# Patient Record
Sex: Female | Born: 1975 | Race: White | Hispanic: No | Marital: Married | Smoking: Former smoker
Health system: Southern US, Community
[De-identification: ages and names within clinical notes are randomized; demographics above are authoritative.]

## PROBLEM LIST (undated history)

## (undated) DIAGNOSIS — E878 Other disorders of electrolyte and fluid balance, not elsewhere classified: Secondary | ICD-10-CM

## (undated) DIAGNOSIS — F329 Major depressive disorder, single episode, unspecified: Secondary | ICD-10-CM

## (undated) DIAGNOSIS — I10 Essential (primary) hypertension: Secondary | ICD-10-CM

## (undated) DIAGNOSIS — K219 Gastro-esophageal reflux disease without esophagitis: Secondary | ICD-10-CM

## (undated) DIAGNOSIS — R519 Headache, unspecified: Secondary | ICD-10-CM

## (undated) DIAGNOSIS — C801 Malignant (primary) neoplasm, unspecified: Secondary | ICD-10-CM

## (undated) DIAGNOSIS — F419 Anxiety disorder, unspecified: Secondary | ICD-10-CM

## (undated) DIAGNOSIS — R609 Edema, unspecified: Secondary | ICD-10-CM

## (undated) DIAGNOSIS — N189 Chronic kidney disease, unspecified: Secondary | ICD-10-CM

## (undated) DIAGNOSIS — F32A Depression, unspecified: Secondary | ICD-10-CM

## (undated) HISTORY — DX: Anxiety disorder, unspecified: F41.9

## (undated) HISTORY — DX: Malignant (primary) neoplasm, unspecified: C80.1

## (undated) HISTORY — PX: CHOLECYSTECTOMY: SHX55

## (undated) HISTORY — DX: Depression, unspecified: F32.A

---
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
## 2006-02-18 ENCOUNTER — Observation Stay: Payer: Self-pay | Admitting: Obstetrics and Gynecology

## 2006-02-22 ENCOUNTER — Observation Stay: Payer: Self-pay | Admitting: Certified Nurse Midwife

## 2006-05-14 ENCOUNTER — Inpatient Hospital Stay: Payer: Self-pay | Admitting: Obstetrics and Gynecology

## 2007-04-19 ENCOUNTER — Ambulatory Visit: Payer: Self-pay | Admitting: Obstetrics and Gynecology

## 2008-09-11 ENCOUNTER — Ambulatory Visit: Payer: Self-pay | Admitting: Obstetrics and Gynecology

## 2008-09-14 ENCOUNTER — Ambulatory Visit: Payer: Self-pay | Admitting: Obstetrics and Gynecology

## 2010-09-02 ENCOUNTER — Ambulatory Visit: Payer: Self-pay | Admitting: Surgery

## 2010-09-05 ENCOUNTER — Ambulatory Visit: Payer: Self-pay | Admitting: Surgery

## 2010-09-08 LAB — PATHOLOGY REPORT

## 2011-11-11 ENCOUNTER — Emergency Department: Payer: Self-pay | Admitting: Emergency Medicine

## 2013-06-12 ENCOUNTER — Ambulatory Visit: Payer: Self-pay | Admitting: Obstetrics and Gynecology

## 2013-06-14 ENCOUNTER — Ambulatory Visit: Payer: Self-pay | Admitting: Obstetrics and Gynecology

## 2015-12-30 ENCOUNTER — Other Ambulatory Visit: Payer: Self-pay | Admitting: Obstetrics and Gynecology

## 2015-12-30 DIAGNOSIS — Z1231 Encounter for screening mammogram for malignant neoplasm of breast: Secondary | ICD-10-CM

## 2016-01-10 ENCOUNTER — Ambulatory Visit
Admission: RE | Admit: 2016-01-10 | Discharge: 2016-01-10 | Disposition: A | Discharge status: Home / Self Care | Payer: BLUE CROSS/BLUE SHIELD | Source: Ambulatory Visit | Attending: Obstetrics and Gynecology | Admitting: Obstetrics and Gynecology

## 2016-01-10 DIAGNOSIS — Z1231 Encounter for screening mammogram for malignant neoplasm of breast: Secondary | ICD-10-CM | POA: Diagnosis not present

## 2017-06-14 ENCOUNTER — Other Ambulatory Visit: Payer: Self-pay | Admitting: Obstetrics and Gynecology

## 2017-06-14 DIAGNOSIS — Z1231 Encounter for screening mammogram for malignant neoplasm of breast: Secondary | ICD-10-CM

## 2017-06-14 LAB — HM PAP SMEAR: HM Pap smear: NEGATIVE

## 2017-06-29 ENCOUNTER — Ambulatory Visit
Admission: RE | Admit: 2017-06-29 | Discharge: 2017-06-29 | Disposition: A | Discharge status: Home / Self Care | Payer: 59 | Source: Ambulatory Visit | Attending: Obstetrics and Gynecology | Admitting: Obstetrics and Gynecology

## 2017-06-29 DIAGNOSIS — Z1231 Encounter for screening mammogram for malignant neoplasm of breast: Secondary | ICD-10-CM | POA: Diagnosis present

## 2017-11-27 ENCOUNTER — Emergency Department
Admission: EM | Admit: 2017-11-27 | Discharge: 2017-11-27 | Disposition: A | Discharge status: Home / Self Care | Payer: 59 | Attending: Emergency Medicine | Admitting: Emergency Medicine

## 2017-11-27 ENCOUNTER — Other Ambulatory Visit: Payer: Self-pay

## 2017-11-27 ENCOUNTER — Emergency Department: Admit: 2017-11-27 | Discharge: 2017-11-27 | Discharge status: Absent without leave | Payer: Self-pay

## 2017-11-27 ENCOUNTER — Encounter: Payer: Self-pay | Admitting: *Deleted

## 2017-11-27 ENCOUNTER — Emergency Department: Payer: 59

## 2017-11-27 DIAGNOSIS — G43809 Other migraine, not intractable, without status migrainosus: Secondary | ICD-10-CM | POA: Diagnosis not present

## 2017-11-27 DIAGNOSIS — R110 Nausea: Secondary | ICD-10-CM | POA: Insufficient documentation

## 2017-11-27 DIAGNOSIS — G43109 Migraine with aura, not intractable, without status migrainosus: Secondary | ICD-10-CM

## 2017-11-27 DIAGNOSIS — R202 Paresthesia of skin: Secondary | ICD-10-CM | POA: Diagnosis present

## 2017-11-27 DIAGNOSIS — R200 Anesthesia of skin: Secondary | ICD-10-CM

## 2017-11-27 LAB — COMPREHENSIVE METABOLIC PANEL
ALT: 26 U/L (ref 14–54)
AST: 20 U/L (ref 15–41)
Albumin: 4.4 g/dL (ref 3.5–5.0)
Alkaline Phosphatase: 71 U/L (ref 38–126)
Anion gap: 11 (ref 5–15)
BUN: 22 mg/dL — ABNORMAL HIGH (ref 6–20)
CO2: 23 mmol/L (ref 22–32)
Calcium: 9.2 mg/dL (ref 8.9–10.3)
Chloride: 104 mmol/L (ref 101–111)
Creatinine, Ser: 0.94 mg/dL (ref 0.44–1.00)
GFR calc Af Amer: >60 mL/min (ref >60)
GFR calc non Af Amer: >60 mL/min (ref >60)
Glucose, Bld: 98 mg/dL (ref 65–99)
Potassium: 3.6 mmol/L (ref 3.5–5.1)
Sodium: 138 mmol/L (ref 135–145)
Total Bilirubin: 0.5 mg/dL (ref 0.3–1.2)
Total Protein: 7.2 g/dL (ref 6.5–8.1)

## 2017-11-27 LAB — DIFFERENTIAL
Basophils Absolute: 0.1 10*3/uL (ref 0–0.1)
Basophils Relative: 1 %
Eosinophils Absolute: 0.4 10*3/uL (ref 0–0.7)
Eosinophils Relative: 4 %
Lymphocytes Relative: 17 %
Lymphs Abs: 1.8 10*3/uL (ref 1.0–3.6)
Monocytes Absolute: 0.7 10*3/uL (ref 0.2–0.9)
Monocytes Relative: 6 %
Neutro Abs: 7.5 10*3/uL — ABNORMAL HIGH (ref 1.4–6.5)
Neutrophils Relative %: 72 %

## 2017-11-27 LAB — CBC
HCT: 41.8 % (ref 35.0–47.0)
Hemoglobin: 14.2 g/dL (ref 12.0–16.0)
MCH: 29.9 pg (ref 26.0–34.0)
MCHC: 33.9 g/dL (ref 32.0–36.0)
MCV: 88.2 fL (ref 80.0–100.0)
Platelets: 221 10*3/uL (ref 150–440)
RBC: 4.74 MIL/uL (ref 3.80–5.20)
RDW: 13.7 % (ref 11.5–14.5)
WBC: 10.5 10*3/uL (ref 3.6–11.0)

## 2017-11-27 LAB — PROTIME-INR
INR: 0.9
Prothrombin Time: 12.1 s (ref 11.4–15.2)

## 2017-11-27 LAB — TROPONIN I: Troponin I: <0.03 ng/mL (ref <0.03)

## 2017-11-27 LAB — APTT: aPTT: 30 seconds (ref 24–36)

## 2017-11-27 MED ORDER — DEXAMETHASONE SODIUM PHOSPHATE 10 MG/ML IJ SOLN
10.0000 mg | Freq: Once | INTRAMUSCULAR | Status: AC
  Administered 2017-11-27: 10 mg via INTRAVENOUS
  Filled 2017-11-27: qty 1

## 2017-11-27 MED ORDER — METOCLOPRAMIDE HCL 5 MG/ML IJ SOLN
10.0000 mg | Freq: Once | INTRAMUSCULAR | Status: AC
  Administered 2017-11-27: 10 mg via INTRAVENOUS
  Filled 2017-11-27: qty 2

## 2017-11-27 MED ORDER — SUMATRIPTAN SUCCINATE 50 MG PO TABS: 50.0000 mg | ORAL_TABLET | Freq: Once | ORAL | 2 refills | Status: DC | PRN

## 2017-11-27 MED ORDER — KETOROLAC TROMETHAMINE 30 MG/ML IJ SOLN
30.0000 mg | Freq: Once | INTRAMUSCULAR | Status: AC
  Administered 2017-11-27: 30 mg via INTRAVENOUS
  Filled 2017-11-27: qty 1

## 2017-11-27 MED ORDER — SODIUM CHLORIDE 0.9 % IV BOLUS (SEPSIS)
1000.0000 mL | Freq: Once | INTRAVENOUS | Status: AC
  Administered 2017-11-27: 1000 mL via INTRAVENOUS

## 2017-11-27 NOTE — ED Provider Notes (Signed)
Patient reports her symptoms are improving and numbness has resolved.  This is likely a migraine variant.  She is stable for outpatient follow-up.   Earleen Newport, MD 11/27/17 2212

## 2017-11-27 NOTE — ED Notes (Signed)
Pt stated that Wednesday she had the worst headache of her life on the left side of her head then Thursday she had episodes of nausea off and on all day Friday she had a dull aching headache on left side of head and face Today pt reports that approx 2p-3p she started having facial numbness with drooling - she reports pressure against her bottom lip but no true sensation - at 6pm she reports that the left side of her face went numb - pt is able to talk in complete sentences that are understandable with no speech deficit noted

## 2017-11-27 NOTE — ED Provider Notes (Signed)
[facility] Emergency Department Provider Note ____________________________________________   First MD Initiated Contact with Patient 11/27/17 [phone]     (approximate)  I have reviewed the triage vital signs and the nursing notes.   HISTORY  Chief Complaint Numbness    HPI Margaret Waller is a 42 y.o. female with no significant past medical history who presents with left facial numbness, acute onset approximately 2 hours ago and involving the entire left side of the face and particularly around the lower lip.  Patient states that for the last 3 days she developed relatively severe headache on the left side of her head and face, associated with nausea, with no vomiting or photophobia.  No prior history of a headache like this.  She then developed the numbness today, which caused her to drool because she could not feel liquid against her bottom lip.  No prior history of the numbness.  Patient denies any recent illness, fevers, chest pain, difficulty breathing, vision changes, or any numbness or weakness throughout the rest of her body.  History reviewed. No pertinent past medical history.  There are no active problems to display for this patient.   History reviewed. No pertinent surgical history.  Prior to Admission medications   Not on File    Allergies Patient has no known allergies.  Family History  Problem Relation Age of Onset  . Breast cancer Neg Hx     Social History Social History   Tobacco Use  . Smoking status: Never Smoker  . Smokeless tobacco: Never Used  Substance Use Topics  . Alcohol use: Yes    Frequency: Never    Comment: rarely  . Drug use: No    Review of Systems  Constitutional: No fever/chills. Eyes: No visual changes. ENT: No neck pain. Cardiovascular: Denies chest pain. Respiratory: Denies shortness of breath. Gastrointestinal: Positive for nausea, no vomiting. Genitourinary: Negative for dysuria.    Musculoskeletal: Negative for back pain. Skin: Negative for rash. Neurological: Positive for headache.   ____________________________________________   PHYSICAL EXAM:  VITAL SIGNS: ED Triage Vitals  Enc Vitals Group     BP 11/27/17 1858 132/78     Pulse Rate 11/27/17 1858 91     Resp 11/27/17 1858 17     Temp 11/27/17 1858 98.9 F (37.2 C)     Temp Source 11/27/17 1858 Oral     SpO2 11/27/17 1858 99 %     Weight 11/27/17 1901 114 lb (51.7 kg)     Height 11/27/17 1901 5\' 1"  (1.549 m)     Head Circumference --      Peak Flow --      Pain Score --      Pain Loc --      Pain Edu? --      Excl. in [place]? --     Constitutional: Alert and oriented.  Relatively comfortable appearing and in no acute distress. Eyes: Conjunctivae are normal.  EOMI.  PERRLA.  No photophobia. Head: Atraumatic. Nose: No congestion/rhinnorhea. Mouth/Throat: Mucous membranes are moist.   Neck: Normal range of motion.  No meningeal signs. Cardiovascular:   Good peripheral circulation. Respiratory: Normal respiratory effort.  No retractions.  Gastrointestinal: No distention.  Musculoskeletal: No lower extremity edema.  Extremities warm and well perfused.  Neurologic:  Normal speech and language. No gross focal neurologic deficits are appreciated.  Motor and sensory intact in all extremities.  Decreased sensation throughout the left face, but cranial nerves III through XII otherwise intact.  Normal coordination with no ataxia. Skin:  Skin is warm and dry. No rash noted. Psychiatric: Mood and affect are normal. Speech and behavior are normal.  ____________________________________________   LABS (all labs ordered are listed, but only abnormal results are displayed)  Labs Reviewed  DIFFERENTIAL - Abnormal; Notable for the following components:      Result Value   Neutro Abs 7.5 (*)    All other components within normal limits  COMPREHENSIVE METABOLIC PANEL - Abnormal; Notable for the following  components:   BUN 22 (*)    All other components within normal limits  PROTIME-INR  APTT  CBC  TROPONIN I  CBG MONITORING, ED  POC URINE PREG, ED   ____________________________________________  EKG  ED ECG REPORT I, Arta Silence, the attending physician, personally viewed and interpreted this ECG.  Date: 11/27/2017 EKG Time: 1904 Rate: 94 Rhythm: normal sinus rhythm QRS Axis: normal Intervals: normal ST/T Wave abnormalities: normal Narrative Interpretation: no evidence of acute ischemia  ____________________________________________  RADIOLOGY  CT head: No ICH or other acute findings.  ____________________________________________   PROCEDURES  Procedure(s) performed: No    Critical Care performed: No ____________________________________________   INITIAL IMPRESSION / ASSESSMENT AND PLAN / ED COURSE  Pertinent labs & imaging results that were available during my care of the patient were reviewed by me and considered in my medical decision making (see chart for details).  42 year old female with no significant past medical history presents with left facial numbness over the last 2 hours, but proceeded with left-sided headache over the last 2 days.  No prior history of the symptoms.  No other recent illness.  Past medical records reviewed in Epic and are noncontributory.  On exam, the patient is relatively well-appearing, the vital signs are normal, and there is decreased sensation to the entire left side of the face but otherwise no neurologic deficits on the road neuro exam.  Given that patient presented within 2-3 hours of symptom onset, we obtained CT head to rule out ICH or findings related to CVA.  However overall the presentation is not consistent with CVA/TIA given the presence of headache, the narrow extent of the symptoms, and the patient's overall lack of stroke risk factors.  Presentation more consistent with complicated migraine, versus less likely  trigeminal neuralgia or other peripheral etiology.  Plan for lab workup, neurology consult and reassess.  ----------------------------------------- 8:33 PM on 11/27/2017 -----------------------------------------  CT is negative.  I consulted Dr. Irish Elders from neurology, who agrees that the presentation is most consistent with a comp gated migraine.  He recommends symptomatic migraine treatment including Decadron, and then reassess.  If symptoms resolve in the ED after several hours, then he recommends likely discharge home.  If symptoms persist then the patient should be admitted for MRI.  I am signing patient out to the oncoming physician Dr. Jimmye Norman.   ____________________________________________   FINAL CLINICAL IMPRESSION(S) / ED DIAGNOSES  Final diagnoses:  Facial numbness      NEW MEDICATIONS STARTED DURING THIS VISIT:  New Prescriptions   No medications on file     Note:  This document was prepared using Dragon voice recognition software and may include unintentional dictation errors.    Arta Silence, MD 11/27/17 2034

## 2017-11-27 NOTE — ED Triage Notes (Signed)
Pt c/o L facial numbness x 2-3 hrs ago ( 1430-1500) today. Pt c/ L facial pain starting x 3 days ago, constant, aching. Pt denies associated sxs, excepting nausea that started Thursday morning at 1100, intermittently occurring Thursday and FRiday. Pt in ambulatory and in no acute distress at this time, no weakness or dizziness at this time. CSS negative, speech clear and pt is oriented x 4.

## 2017-11-27 NOTE — ED Notes (Addendum)
Patient transported to CT - per Dr Cherylann Banas stated that pt is not being called a code stroke

## 2017-12-13 ENCOUNTER — Other Ambulatory Visit: Payer: Self-pay | Admitting: Neurology

## 2017-12-13 DIAGNOSIS — R200 Anesthesia of skin: Secondary | ICD-10-CM

## 2017-12-13 DIAGNOSIS — G43109 Migraine with aura, not intractable, without status migrainosus: Secondary | ICD-10-CM

## 2017-12-13 DIAGNOSIS — R202 Paresthesia of skin: Secondary | ICD-10-CM

## 2017-12-21 ENCOUNTER — Ambulatory Visit
Admission: RE | Admit: 2017-12-21 | Discharge: 2017-12-21 | Disposition: A | Discharge status: Home / Self Care | Payer: 59 | Source: Ambulatory Visit | Attending: Neurology | Admitting: Neurology

## 2017-12-21 DIAGNOSIS — J349 Unspecified disorder of nose and nasal sinuses: Secondary | ICD-10-CM | POA: Diagnosis not present

## 2017-12-21 DIAGNOSIS — R200 Anesthesia of skin: Secondary | ICD-10-CM | POA: Insufficient documentation

## 2017-12-21 DIAGNOSIS — G43109 Migraine with aura, not intractable, without status migrainosus: Secondary | ICD-10-CM | POA: Diagnosis present

## 2017-12-21 DIAGNOSIS — R202 Paresthesia of skin: Secondary | ICD-10-CM | POA: Diagnosis present

## 2017-12-21 MED ORDER — GADOBENATE DIMEGLUMINE 529 MG/ML IV SOLN
10.0000 mL | Freq: Once | INTRAVENOUS | Status: AC | PRN
  Administered 2017-12-21: 10 mL via INTRAVENOUS

## 2018-06-20 ENCOUNTER — Other Ambulatory Visit: Payer: Self-pay | Admitting: Obstetrics and Gynecology

## 2018-06-20 DIAGNOSIS — Z1231 Encounter for screening mammogram for malignant neoplasm of breast: Secondary | ICD-10-CM

## 2018-07-05 ENCOUNTER — Ambulatory Visit
Admission: RE | Admit: 2018-07-05 | Discharge: 2018-07-05 | Disposition: A | Discharge status: Home / Self Care | Payer: 59 | Source: Ambulatory Visit | Attending: Obstetrics and Gynecology | Admitting: Obstetrics and Gynecology

## 2018-07-05 DIAGNOSIS — Z1231 Encounter for screening mammogram for malignant neoplasm of breast: Secondary | ICD-10-CM | POA: Insufficient documentation

## 2018-07-12 ENCOUNTER — Other Ambulatory Visit: Payer: Self-pay | Admitting: Obstetrics and Gynecology

## 2018-07-12 DIAGNOSIS — N6489 Other specified disorders of breast: Secondary | ICD-10-CM

## 2018-07-12 DIAGNOSIS — R921 Mammographic calcification found on diagnostic imaging of breast: Secondary | ICD-10-CM

## 2018-07-12 DIAGNOSIS — R928 Other abnormal and inconclusive findings on diagnostic imaging of breast: Secondary | ICD-10-CM

## 2018-07-18 ENCOUNTER — Ambulatory Visit
Admission: RE | Admit: 2018-07-18 | Discharge: 2018-07-18 | Disposition: A | Discharge status: Home / Self Care | Payer: 59 | Source: Ambulatory Visit | Attending: Obstetrics and Gynecology | Admitting: Obstetrics and Gynecology

## 2018-07-18 DIAGNOSIS — N6489 Other specified disorders of breast: Secondary | ICD-10-CM

## 2018-07-18 DIAGNOSIS — R921 Mammographic calcification found on diagnostic imaging of breast: Secondary | ICD-10-CM | POA: Diagnosis present

## 2018-07-18 DIAGNOSIS — R928 Other abnormal and inconclusive findings on diagnostic imaging of breast: Secondary | ICD-10-CM | POA: Diagnosis present

## 2019-01-23 ENCOUNTER — Other Ambulatory Visit (HOSPITAL_COMMUNITY): Payer: Self-pay | Admitting: Neurology

## 2019-01-23 ENCOUNTER — Other Ambulatory Visit: Payer: Self-pay | Admitting: Neurology

## 2019-01-23 DIAGNOSIS — G4459 Other complicated headache syndrome: Secondary | ICD-10-CM

## 2019-01-28 ENCOUNTER — Other Ambulatory Visit: Payer: Self-pay

## 2019-01-28 ENCOUNTER — Ambulatory Visit
Admission: RE | Admit: 2019-01-28 | Discharge: 2019-01-28 | Disposition: A | Discharge status: Home / Self Care | Payer: 59 | Source: Ambulatory Visit | Attending: Neurology | Admitting: Neurology

## 2019-01-28 DIAGNOSIS — G4459 Other complicated headache syndrome: Secondary | ICD-10-CM | POA: Diagnosis present

## 2019-01-28 MED ORDER — GADOBUTROL 1 MMOL/ML IV SOLN
5.0000 mL | Freq: Once | INTRAVENOUS | Status: AC | PRN
  Administered 2019-01-28: 5 mL via INTRAVENOUS

## 2019-05-16 ENCOUNTER — Ambulatory Visit (INDEPENDENT_AMBULATORY_CARE_PROVIDER_SITE_OTHER): Payer: 59 | Admitting: Physician Assistant

## 2019-05-16 ENCOUNTER — Encounter: Payer: Self-pay | Admitting: Physician Assistant

## 2019-05-16 ENCOUNTER — Other Ambulatory Visit: Payer: Self-pay

## 2019-05-16 VITALS — BP 122/87 | HR 93 | Temp 98.7°F | Resp 16 | Ht 60.5 in | Wt 135.0 lb

## 2019-05-16 DIAGNOSIS — Z131 Encounter for screening for diabetes mellitus: Secondary | ICD-10-CM

## 2019-05-16 DIAGNOSIS — F4321 Adjustment disorder with depressed mood: Secondary | ICD-10-CM

## 2019-05-16 DIAGNOSIS — F4323 Adjustment disorder with mixed anxiety and depressed mood: Secondary | ICD-10-CM | POA: Diagnosis not present

## 2019-05-16 DIAGNOSIS — Z114 Encounter for screening for human immunodeficiency virus [HIV]: Secondary | ICD-10-CM

## 2019-05-16 DIAGNOSIS — G43809 Other migraine, not intractable, without status migrainosus: Secondary | ICD-10-CM | POA: Diagnosis not present

## 2019-05-16 DIAGNOSIS — Z13220 Encounter for screening for lipoid disorders: Secondary | ICD-10-CM

## 2019-05-16 DIAGNOSIS — Z1329 Encounter for screening for other suspected endocrine disorder: Secondary | ICD-10-CM

## 2019-05-16 DIAGNOSIS — Z130 Encounter for screening for diseases of the blood and blood-forming organs and certain disorders involving the immune mechanism: Secondary | ICD-10-CM

## 2019-05-16 MED ORDER — ESCITALOPRAM OXALATE 10 MG PO TABS: 10.0000 mg | ORAL_TABLET | Freq: Every day | ORAL | 0 refills | Status: DC

## 2019-05-16 NOTE — Patient Instructions (Signed)
Serotonin Syndrome  Serotonin is a chemical in your body (neurotransmitter) that helps to control several functions, such as:  · Brain and nerve cell function.  · Mood and emotions.  · Memory.  · Eating.  · Sleeping.  · Sexual activity.  · Stress response.  Having too much serotonin in your body can cause serotonin syndrome. This condition can be harmful to your brain and nerve cells. This can be a life-threatening condition.  What are the causes?  This condition may be caused by taking medicines or drugs that increase the level of serotonin in your body, such as:  · Antidepressant medicines.  · Migraine medicines.  · Certain pain medicines.  · Certain drugs, including ecstasy, LSD, cocaine, and amphetamines.  · Over-the-counter cough or cold medicines that contain dextromethorphan.  · Certain herbal supplements, including St. John's wort, ginseng, and nutmeg.  This condition usually occurs when you take these medicines or drugs in combination, but it can also happen with a high dose of a single medicine or drug.  What increases the risk?  You are more likely to develop this condition if:  · You just started taking a medicine or drug that increases the level of serotonin in the body.  · You recently increased the dose of a medicine or drug that increases the level of serotonin in the body.  · You take more than one medicine or drug that increases the level of serotonin in the body.  What are the signs or symptoms?  Symptoms of this condition usually start within several hours of taking a medicine or drug. Symptoms may be mild or severe. Mild symptoms include:  · Sweating.  · Restlessness or agitation.  · Muscle twitching or stiffness.  · Rapid heart rate.  · Nausea and vomiting.  · Diarrhea.  · Headache.  · Shivering or goose bumps.  · Confusion.  Severe symptoms include:  · Irregular heartbeat.  · Seizures.  · Loss of consciousness.  · High fever.  How is this diagnosed?  This condition may be diagnosed based  on:  · Your medical history.   · A physical exam.  · Your prior use of drugs and medicines.  · Blood or urine tests. These may be used to rule out other causes of your symptoms.  How is this treated?  The treatment for this condition depends on the severity of your symptoms.  · For mild cases, stopping the medicine or drug that caused your condition is usually all that is needed.  · For moderate to severe cases, treatment in a hospital may be needed to prevent or manage life-threatening symptoms. This may include medicines to control your symptoms, IV fluids, interventions to support your breathing, and treatments to control your body temperature.  Follow these instructions at home:  Medicines    · Take over-the-counter and prescription medicines only as told by your health care provider. This is important.  · Check with your health care provider before you start taking any new prescriptions, over-the-counter medicines, herbs, or supplements.  · Avoid combining any medicines that can cause this condition to occur.  Lifestyle    · Maintain a healthy lifestyle.  ? Eat a healthy diet that includes plenty of vegetables, fruits, whole grains, low-fat dairy products, and lean protein. Do not eat a lot of foods that are high in fat, added sugars, or salt.  ? Get the right amount and quality of sleep. Most adults need 7-9 hours of sleep each night.  ?   or tobacco, such as cigarettes and e-cigarettes. If you need help quitting, ask your health care provider.  Keep all follow-up visits as told by your health care provider. This is important. Contact a health care provider if:  Your symptoms do not  improve or they get worse. Get help right away if you:  Have worsening confusion, severe headache, chest pain, high fever, seizures, or loss of consciousness.  Experience serious side effects of medicine, such as swelling of your face, lips, tongue, or throat.  Have serious thoughts about hurting yourself or others. These symptoms may represent a serious problem that is an emergency. Do not wait to see if the symptoms will go away. Get medical help right away. Call your local emergency services (911 in the [place] Do not drive yourself to the hospital. If you ever feel like you may hurt yourself or others, or have thoughts about taking your own life, get help right away. You can go to your nearest emergency department or call:  Your local emergency services (911 in the [place]  A suicide crisis helpline, such as the [facility] at 512-657-4974. This is open 24 hours a day. Summary  Serotonin is a brain chemical that helps to regulate the nervous system. High levels of serotonin in the body can cause serotonin syndrome, which is a very dangerous condition.  This condition may be caused by taking medicines or drugs that increase the level of serotonin in your body.  Treatment depends on the severity of your symptoms. For mild cases, stopping the medicine or drug that caused your condition is usually all that is needed.  Check with your health care provider before you start taking any new prescriptions, over-the-counter medicines, herbs, or supplements. This information is not intended to replace advice given to you by your health care provider. Make sure you discuss any questions you have with your health care provider. Document Released: 12/03/2004 Document Revised: 12/03/2017 Document Reviewed: 12/03/2017 Elsevier Patient Education  2020 [employer].

## 2019-05-16 NOTE — Progress Notes (Signed)
Patient: Margaret Waller, Female    DOB: 10-27-1976, 43 y.o.   MRN: [id number] Visit Date: 05/16/2019  Today's Provider: Trinna Post, PA-C   Chief Complaint  Patient presents with  . New Patient (Initial Visit)  . Insomnia   Subjective:     Annual physical exam Margaret Waller is a 43 y.o. female who presents today to establish care. Patient reports that she is having insomnia, patient reports she can fall sleep but wakes up at 3 am. Patient reports that she takes 6 mg of melatonin. Patient reports that she is depressed, her father passed away two months ago and her son is in the [employer] in [place].   Previously saw Dr. Bernita Buffy for primary care but has not seen him in many years.   Living in [place] with husband and youngest daughter. Two oldest sons are grown and out of the house. 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  Sees [facility] and had PAP in 2017/02/06.   Acid Reflux: Father died of esophageal cancer two months ago. Sister with stomach ulcers. Currently taking omeprazole 20 mg daily. Takes medication every morning on an empty stomach 30 min prior to meal. Reports that by night time when she lays down heart burn worse. Does drink one cup of coffee. Doesn't eat spicy food. She does have migraines and is on prophylactic topamax as well as pamelor. She previously got SPG blocks but has been unable to do so due to [place]. She reports headaches have worsened since then she will take [facility] powder daily, sometimes multiple times a day.   Migraine: She is followed by Dr. Manuella Ghazi at [facility] and takes topamax 200 mg daily, pamelor 50 mg daily and received SPG blocks previously.  She has an upcoming sleep study scheduled 05/29/2019.   Depression and Anxiety: has a history of the same and was treated in the past with Lexapro which she reports worked well.  Wt Readings from Last 3 Encounters:  05/16/19 135 lb (61.2 kg)   11/27/17 114 lb (51.7 kg)    -----------------------------------------------------------------   Review of Systems  Constitutional: Positive for fatigue.  HENT: Negative.   Eyes: Negative.   Respiratory: Negative.   Cardiovascular: Negative.   Gastrointestinal: Negative.   Endocrine: Negative.   Genitourinary: Negative.   Musculoskeletal: Negative.   Skin: Negative.   Allergic/Immunologic: Negative.   Neurological: Positive for headaches.  Hematological: Negative.   Psychiatric/Behavioral: Positive for sleep disturbance. The patient is nervous/anxious.     Social History      She  reports that she has never smoked. She has never used smokeless tobacco. She reports previous alcohol use. She reports that she does not use drugs.       Social History   Socioeconomic History  . Marital status: Married    Spouse name: Not on file  . Number of children: Not on file  . Years of education: Not on file  . Highest education level: Not on file  Occupational History  . Not on file  Social Needs  . Financial resource strain: Not on file  . Food insecurity    Worry: Not on file    Inability: Not on file  . Transportation needs    Medical: Not on file    Non-medical: Not on file  Tobacco Use  . Smoking status: Never Smoker  . Smokeless tobacco: Never Used  Substance and Sexual Activity  .  Alcohol use: Not Currently    Frequency: Never    Comment: rarely  . Drug use: No  . Sexual activity: Never    Birth control/protection: Surgical  Lifestyle  . Physical activity    Days per week: Not on file    Minutes per session: Not on file  . Stress: Not on file  Relationships  . Social Herbalist on phone: Not on file    Gets together: Not on file    Attends religious service: Not on file    Active member of club or organization: Not on file    Attends meetings of clubs or organizations: Not on file    Relationship status: Not on file  Other Topics Concern  . Not  on file  Social History Narrative  . Not on file    Past Medical History:  Diagnosis Date  . Anxiety   . Cancer ([place])   . Depression      There are no active problems to display for this patient.   No past surgical history on file.  Family History        Family Status  Relation Name Status  . Ethlyn Daniels  (Not Specified)  . Ethlyn Daniels  (Not Specified)  . Mother  Alive  . Father  Deceased  . Sister  Alive  . Daughter  Alive  . Son  Alive  . Son  Alive        Her family history includes Breast cancer in her paternal aunt and paternal aunt; Cancer in her father; Healthy in her daughter, son, and son; Hypertension in her mother; Lupus in her sister; Multiple sclerosis in her sister; Skin cancer in her mother.      No Known Allergies   Current Outpatient Medications:  .  Cholecalciferol (VITAMIN D-1000 MAX ST) 25 MCG (1000 UT) tablet, Take by mouth., Disp: , Rfl:  .  loratadine (CLARITIN) 10 MG tablet, Take 10 mg by mouth daily., Disp: , Rfl:  .  nortriptyline (PAMELOR) 50 MG capsule, Take by mouth., Disp: , Rfl:  .  omeprazole (PRILOSEC) 20 MG capsule, Take 20 mg by mouth daily., Disp: , Rfl:  .  Topiramate ER (TROKENDI XR) 200 MG CP24, Take by mouth., Disp: , Rfl:    Patient Care Team: Paulene Floor as PCP - General (Physician Assistant)    Objective:    Vitals: BP 122/87 (BP Location: Left Arm, Patient Position: Sitting, Cuff Size: Normal)   Pulse 93   Temp 98.7 F (37.1 C) (Oral)   Resp 16   Ht 5' 0.5" (1.537 m)   Wt 135 lb (61.2 kg)   SpO2 98%   BMI 25.93 kg/m    Vitals:   05/16/19 1112  BP: 122/87  Pulse: 93  Resp: 16  Temp: 98.7 F (37.1 C)  TempSrc: Oral  SpO2: 98%  Weight: 135 lb (61.2 kg)  Height: 5' 0.5" (1.537 m)     Physical Exam Constitutional:      Appearance: Normal appearance.  Cardiovascular:     Rate and Rhythm: Normal rate and regular rhythm.     Heart sounds: Normal heart sounds.  Pulmonary:     Effort: Pulmonary  effort is normal.     Breath sounds: Normal breath sounds.  Skin:    General: Skin is warm and dry.  Neurological:     Mental Status: She is alert and oriented to person, place, and time. Mental status is at  baseline.  Psychiatric:        Mood and Affect: Mood normal.        Behavior: Behavior normal.      Depression Screen PHQ 2/9 Scores 05/16/2019  PHQ - 2 Score 3  PHQ- 9 Score 16       Assessment & Plan:     Routine Health Maintenance and Physical Exam  Exercise Activities and Dietary recommendations Goals   None      There is no immunization history on file for this patient.  Health Maintenance  Topic Date Due  . HIV Screening  04/08/1990  . TETANUS/TDAP  04/08/1994  . PAP SMEAR-Modifier  04/08/1996  . INFLUENZA VACCINE  06/10/2019     Discussed health benefits of physical activity, and encouraged her to engage in regular exercise appropriate for her age and condition.    1. Other migraine without status migrainosus, not intractable  [facility]. She uses excessive amounts of BC powder and I suspect this is contributing to her GI issues. Recommended she contact neurology to schedule procedure so she will not have to use BC powder, counseled on risks associated with this.  2. Adjustment reaction with anxiety and depression  - escitalopram (LEXAPRO) 10 MG tablet; Take 1 tablet (10 mg total) by mouth daily.  Dispense: 90 tablet; Refill: 0  3. Diabetes mellitus screening  - Comprehensive Metabolic Panel (CMET)  4. Thyroid disorder screening  - TSH  5. Screening cholesterol level  - Lipid Profile  6. Encounter for screening for HIV  - HIV antibody (with reflex)  7. Screening for deficiency anemia  - CBC with Differential  8. Grief  - Ambulatory referral to Chronic Care Management Services  The entirety of the information documented in the History of Present Illness, Review of Systems and Physical Exam were personally obtained  by me. Portions of this information were initially documented by Lynford Humphrey, CMA and reviewed by me for thoroughness and accuracy.   F/u 6 weeks for anxiety and depression --------------------------------------------------------------------    Trinna Post, PA-C  [place]

## 2019-05-17 ENCOUNTER — Ambulatory Visit: Payer: Self-pay

## 2019-05-17 LAB — COMPREHENSIVE METABOLIC PANEL
ALT: 17 IU/L (ref 0–32)
AST: 12 IU/L (ref 0–40)
Albumin/Globulin Ratio: 2 (ref 1.2–2.2)
Albumin: 4.6 g/dL (ref 3.8–4.8)
Alkaline Phosphatase: 76 IU/L (ref 39–117)
BUN/Creatinine Ratio: 9 (ref 9–23)
BUN: 9 mg/dL (ref 6–24)
Bilirubin Total: 0.3 mg/dL (ref 0.0–1.2)
CO2: 19 mmol/L — ABNORMAL LOW (ref 20–29)
Calcium: 9.3 mg/dL (ref 8.7–10.2)
Chloride: 112 mmol/L — ABNORMAL HIGH (ref 96–106)
Creatinine, Ser: 1.02 mg/dL — ABNORMAL HIGH (ref 0.57–1.00)
GFR calc Af Amer: 77 mL/min/{1.73_m2} (ref >59)
GFR calc non Af Amer: 67 mL/min/{1.73_m2} (ref >59)
Globulin, Total: 2.3 g/dL (ref 1.5–4.5)
Glucose: 83 mg/dL (ref 65–99)
Potassium: 4 mmol/L (ref 3.5–5.2)
Sodium: 144 mmol/L (ref 134–144)
Total Protein: 6.9 g/dL (ref 6.0–8.5)

## 2019-05-17 LAB — LIPID PANEL
Chol/HDL Ratio: 2.9 ratio (ref 0.0–4.4)
Cholesterol, Total: 167 mg/dL (ref 100–199)
HDL: 58 mg/dL (ref >39)
LDL Calculated: 95 mg/dL (ref 0–99)
Triglycerides: 68 mg/dL (ref 0–149)
VLDL Cholesterol Cal: 14 mg/dL (ref 5–40)

## 2019-05-17 LAB — CBC WITH DIFFERENTIAL/PLATELET
Basophils Absolute: 0.1 10*3/uL (ref 0.0–0.2)
Basos: 1 %
EOS (ABSOLUTE): 0.3 10*3/uL (ref 0.0–0.4)
Eos: 4 %
Hematocrit: 41.6 % (ref 34.0–46.6)
Hemoglobin: 14 g/dL (ref 11.1–15.9)
Immature Grans (Abs): 0 10*3/uL (ref 0.0–0.1)
Immature Granulocytes: 0 %
Lymphocytes Absolute: 2.2 10*3/uL (ref 0.7–3.1)
Lymphs: 34 %
MCH: 28.3 pg (ref 26.6–33.0)
MCHC: 33.7 g/dL (ref 31.5–35.7)
MCV: 84 fL (ref 79–97)
Monocytes Absolute: 0.5 10*3/uL (ref 0.1–0.9)
Monocytes: 7 %
Neutrophils Absolute: 3.5 10*3/uL (ref 1.4–7.0)
Neutrophils: 54 %
Platelets: 270 10*3/uL (ref 150–450)
RBC: 4.95 x10E6/uL (ref 3.77–5.28)
RDW: 13.3 % (ref 11.7–15.4)
WBC: 6.5 10*3/uL (ref 3.4–10.8)

## 2019-05-17 LAB — HIV ANTIBODY (ROUTINE TESTING W REFLEX): HIV Screen 4th Generation wRfx: NONREACTIVE

## 2019-05-17 LAB — TSH: TSH: 0.793 u[IU]/mL (ref 0.450–4.500)

## 2019-05-17 NOTE — Chronic Care Management (AMB) (Signed)
    Care Management   Unsuccessful Call Note 05/17/2019 Name: FAYRENE TOWNER MRN: [id number] DOB: [birth year]  Lashaunda Schild is a 43 year old female who sees Carles Collet, [place] for primary care. Ms. Terrilee Croak asked the CCM team to consult the patient for care management, specifically social work secondary to her need for grief counseling.    Was unable to reach patient via telephone today for introduction to CCM Services. I have left HIPAA compliant voicemail asking patient to return my call. (unsuccessful outreach #1).   Plan: Will follow-up within 7 business days via telephone.     Capri Raben E. Rollene Rotunda, RN, BSN Nurse Care Coordinator [facility] Care Management [phone]

## 2019-05-26 ENCOUNTER — Ambulatory Visit: Payer: Self-pay | Admitting: *Deleted

## 2019-05-26 NOTE — Chronic Care Management (AMB) (Signed)
    Care Management   Unsuccessful Call Note 05/26/2019 Name: Margaret Waller MRN: [id number] DOB: 01/06/1976  Patient  is a 43 year old female who sees Carles Collet, [place] for primary care. Carles Collet, PA-C asked the CCM team to consult the patient for grief counseling.       This social worker was unable to reach patient via telephone today for consent for CCM services. I have left HIPAA compliant voicemail asking patient to return my call. (unsuccessful outreach #2).   Plan: Will follow-up within 7 business days via telephone.      Elliot Gurney, [place] Worker  [place] [facility] Care Management [phone]

## 2019-06-05 ENCOUNTER — Ambulatory Visit: Payer: Self-pay | Admitting: *Deleted

## 2019-06-05 NOTE — Chronic Care Management (AMB) (Signed)
    Care Management   Unsuccessful Call Note 06/05/2019 Name: Margaret Waller MRN: [id number] DOB: [birth year]  Patient is a 43 year old female who sees Carles Collet, [place] for primary care. Carles Collet, PA-C asked the CCM team to consult the patient for counseling and mental health resources.     This social worker was unable to reach patient via telephone today to obtain consent for CM services. I have left HIPAA compliant voicemail asking patient to return my call. (unsuccessful outreach #3).   Plan: Three attempts made to reach patient to consent for services, which have been unsuccessful. This Education officer, museum will cease any further attempts to reach out to patient, however will be happy to engage patient upon her return call.    Elliot Gurney, LCSW Clinical Social Worker  [place] [facility] Care Management [phone]     Signature

## 2019-06-14 ENCOUNTER — Encounter: Payer: Self-pay | Admitting: Physician Assistant

## 2019-06-14 DIAGNOSIS — F4323 Adjustment disorder with mixed anxiety and depressed mood: Secondary | ICD-10-CM

## 2019-06-14 MED ORDER — BUSPIRONE HCL 7.5 MG PO TABS: 7.5000 mg | ORAL_TABLET | Freq: Two times a day (BID) | ORAL | 0 refills | Status: DC

## 2019-06-14 NOTE — Telephone Encounter (Signed)
Please schedule patient for 6 week follow up either telephone or video for anxiety.

## 2019-06-22 ENCOUNTER — Other Ambulatory Visit: Payer: Self-pay | Admitting: Certified Nurse Midwife

## 2019-06-22 DIAGNOSIS — Z1231 Encounter for screening mammogram for malignant neoplasm of breast: Secondary | ICD-10-CM

## 2019-06-28 ENCOUNTER — Encounter: Payer: Self-pay | Admitting: Physician Assistant

## 2019-07-06 ENCOUNTER — Encounter: Payer: Self-pay | Admitting: Physician Assistant

## 2019-07-06 NOTE — Telephone Encounter (Signed)
LMOVM for pt to cb to schedule ov or virtual appt

## 2019-07-07 ENCOUNTER — Telehealth (INDEPENDENT_AMBULATORY_CARE_PROVIDER_SITE_OTHER): Payer: 59 | Admitting: Physician Assistant

## 2019-07-07 ENCOUNTER — Encounter: Payer: Self-pay | Admitting: Physician Assistant

## 2019-07-07 DIAGNOSIS — E878 Other disorders of electrolyte and fluid balance, not elsewhere classified: Secondary | ICD-10-CM | POA: Diagnosis not present

## 2019-07-07 DIAGNOSIS — F32A Depression, unspecified: Secondary | ICD-10-CM

## 2019-07-07 DIAGNOSIS — F329 Major depressive disorder, single episode, unspecified: Secondary | ICD-10-CM | POA: Diagnosis not present

## 2019-07-07 MED ORDER — BUPROPION HCL ER (SR) 150 MG PO TB12: ORAL_TABLET | ORAL | 1 refills | Status: DC

## 2019-07-07 NOTE — Telephone Encounter (Signed)
Patient is scheduled for virtual appt today.

## 2019-07-07 NOTE — Progress Notes (Signed)
Patient: Margaret Waller Female    DOB: January 25, 1976   43 y.o.   MRN: [id number] Visit Date: 07/07/2019  Today's Provider: Trinna Post, PA-C   Chief Complaint  Patient presents with  . Anxiety  . Depression   Subjective:    Virtual Visit via Video Note  I connected with Margaret Waller on 07/07/19 at  1:20 PM EDT by a video enabled telemedicine application and verified that I am speaking with the correct person using two identifiers.   I discussed the limitations of evaluation and management by telemedicine and the availability of in person appointments. The patient expressed understanding and agreed to proceed.  Patient location: home Provider location: [place] office  Persons involved in the visit: patient, provider    HPI   Patient presenting for follow up of anxiety and depression. She was last seen on 05/16/2019. She was restarted on Lexapro 10 mg daily which had worked well for her in the past. She reported night sweats and then was change dto Buspar 7.5 mg BID.  She reports this is helping with anxiety however her headaches have increased. Had gotten SPG block on 06/20/2019 which resolved headaches. Now she reports her headaches have returned and typically this procedure provides 3 months of relief. She is again using goody's powder for relief. She is also not sleeping well.   She has some left sided low back pain that has been a dull ache and more recently was sharp. She denies heavy lifting or additional exertion. She denies urinary issues, abdominal pain, fever, chills. She is concerned this could be related to her lab abnormalities on most recent CMET.  CMP Latest Ref Rng & Units 05/16/2019 11/27/2017  Glucose 65 - 99 mg/dL 83 98  BUN 6 - 24 mg/dL 9 22(H)  Creatinine 0.57 - 1.00 mg/dL 1.02(H) 0.94  Sodium 134 - 144 mmol/L 144 138  Potassium 3.5 - 5.2 mmol/L 4.0 3.6  Chloride 96 - 106 mmol/L 112(H) 104  CO2 20 - 29 mmol/L 19(L) 23   Calcium 8.7 - 10.2 mg/dL 9.3 9.2  Total Protein 6.0 - 8.5 g/dL 6.9 7.2  Total Bilirubin 0.0 - 1.2 mg/dL 0.3 0.5  Alkaline Phos 39 - 117 IU/L 76 71  AST 0 - 40 IU/L 12 20  ALT 0 - 32 IU/L 17 26       No Known Allergies   Current Outpatient Medications:  .  Cholecalciferol (VITAMIN D-1000 MAX ST) 25 MCG (1000 UT) tablet, Take by mouth., Disp: , Rfl:  .  loratadine (CLARITIN) 10 MG tablet, Take 10 mg by mouth daily., Disp: , Rfl:  .  nortriptyline (PAMELOR) 50 MG capsule, Take by mouth., Disp: , Rfl:  .  omeprazole (PRILOSEC) 20 MG capsule, Take 20 mg by mouth daily., Disp: , Rfl:  .  Topiramate ER (TROKENDI XR) 200 MG CP24, Take by mouth., Disp: , Rfl:  .  buPROPion (WELLBUTRIN SR) 150 MG 12 hr tablet, Take 1 pill once daily for three days. Then take 1 pill twice daily onward., Disp: 90 tablet, Rfl: 1  Review of Systems  Constitutional: Negative for appetite change, chills, fatigue and fever.  Respiratory: Negative for chest tightness and shortness of breath.   Cardiovascular: Negative for chest pain and palpitations.  Gastrointestinal: Negative for abdominal pain, nausea and vomiting.  Neurological: Negative for dizziness and weakness.    Social History   Tobacco Use  . Smoking status: Never Smoker  .  Smokeless tobacco: Never Used  Substance Use Topics  . Alcohol use: Not Currently    Frequency: Never    Comment: rarely      Objective:   There were no vitals taken for this visit. There were no vitals filed for this visit.   Physical Exam Constitutional:      Appearance: Normal appearance.  Pulmonary:     Effort: Pulmonary effort is normal. No respiratory distress.  Neurological:     Mental Status: She is alert.  Psychiatric:        Mood and Affect: Mood normal.        Behavior: Behavior normal.      No results found for any visits on 07/07/19.     Assessment & Plan    1. Depression, unspecified depression type  Will switch buspar to wellbutrin. I am  unsure if her headaches are related to buspar, will trial a different medication. She seemed to have a reaction to Lexapro. If this is not helpful to patient, we will refer to psychiatry.   - buPROPion (WELLBUTRIN SR) 150 MG 12 hr tablet; Take 1 pill once daily for three days. Then take 1 pill twice daily onward.  Dispense: 90 tablet; Refill: 1  2. High serum chloride  Recheck. I suspect her lab abnormalities are related to her migraine medications.  - Comprehensive Metabolic Panel (CMET)  The entirety of the information documented in the History of Present Illness, Review of Systems and Physical Exam were personally obtained by me. Portions of this information were initially documented by April M. Sabra Heck, CMA and reviewed by me for thoroughness and accuracy.      Trinna Post, PA-C  [facility]

## 2019-07-11 LAB — COMPREHENSIVE METABOLIC PANEL
ALT: 14 IU/L (ref 0–32)
AST: 12 IU/L (ref 0–40)
Albumin/Globulin Ratio: 1.8 (ref 1.2–2.2)
Albumin: 4.9 g/dL — ABNORMAL HIGH (ref 3.8–4.8)
Alkaline Phosphatase: 80 IU/L (ref 39–117)
BUN/Creatinine Ratio: 7 — ABNORMAL LOW (ref 9–23)
BUN: 8 mg/dL (ref 6–24)
Bilirubin Total: 0.7 mg/dL (ref 0.0–1.2)
CO2: 18 mmol/L — ABNORMAL LOW (ref 20–29)
Calcium: 9.6 mg/dL (ref 8.7–10.2)
Chloride: 107 mmol/L — ABNORMAL HIGH (ref 96–106)
Creatinine, Ser: 1.22 mg/dL — ABNORMAL HIGH (ref 0.57–1.00)
GFR calc Af Amer: 62 mL/min/{1.73_m2} (ref >59)
GFR calc non Af Amer: 54 mL/min/{1.73_m2} — ABNORMAL LOW (ref >59)
Globulin, Total: 2.7 g/dL (ref 1.5–4.5)
Glucose: 89 mg/dL (ref 65–99)
Potassium: 4 mmol/L (ref 3.5–5.2)
Sodium: 141 mmol/L (ref 134–144)
Total Protein: 7.6 g/dL (ref 6.0–8.5)

## 2019-07-21 ENCOUNTER — Encounter: Payer: Self-pay | Admitting: Physician Assistant

## 2019-07-21 ENCOUNTER — Other Ambulatory Visit: Payer: Self-pay | Admitting: Physician Assistant

## 2019-07-21 DIAGNOSIS — R7989 Other specified abnormal findings of blood chemistry: Secondary | ICD-10-CM

## 2019-07-21 MED ORDER — VITAMIN D (ERGOCALCIFEROL) 1.25 MG (50000 UNIT) PO CAPS: 50000.0000 [IU] | ORAL_CAPSULE | ORAL | 0 refills | Status: AC

## 2019-07-25 ENCOUNTER — Ambulatory Visit
Admission: RE | Admit: 2019-07-25 | Discharge: 2019-07-25 | Disposition: A | Discharge status: Home / Self Care | Payer: 59 | Source: Ambulatory Visit | Attending: Certified Nurse Midwife | Admitting: Certified Nurse Midwife

## 2019-07-25 DIAGNOSIS — Z1231 Encounter for screening mammogram for malignant neoplasm of breast: Secondary | ICD-10-CM | POA: Insufficient documentation

## 2019-08-22 ENCOUNTER — Encounter: Payer: Self-pay | Admitting: Physician Assistant

## 2019-08-22 DIAGNOSIS — R7989 Other specified abnormal findings of blood chemistry: Secondary | ICD-10-CM

## 2019-08-22 LAB — COMPREHENSIVE METABOLIC PANEL
ALT: 16 IU/L (ref 0–32)
AST: 13 IU/L (ref 0–40)
Albumin/Globulin Ratio: 1.8 (ref 1.2–2.2)
Albumin: 4.6 g/dL (ref 3.8–4.8)
Alkaline Phosphatase: 94 IU/L (ref 39–117)
BUN/Creatinine Ratio: 8 — ABNORMAL LOW (ref 9–23)
BUN: 10 mg/dL (ref 6–24)
Bilirubin Total: 0.5 mg/dL (ref 0.0–1.2)
CO2: 20 mmol/L (ref 20–29)
Calcium: 9.4 mg/dL (ref 8.7–10.2)
Chloride: 107 mmol/L — ABNORMAL HIGH (ref 96–106)
Creatinine, Ser: 1.19 mg/dL — ABNORMAL HIGH (ref 0.57–1.00)
GFR calc Af Amer: 64 mL/min/{1.73_m2} (ref >59)
GFR calc non Af Amer: 56 mL/min/{1.73_m2} — ABNORMAL LOW (ref >59)
Globulin, Total: 2.6 g/dL (ref 1.5–4.5)
Glucose: 81 mg/dL (ref 65–99)
Potassium: 3.9 mmol/L (ref 3.5–5.2)
Sodium: 141 mmol/L (ref 134–144)
Total Protein: 7.2 g/dL (ref 6.0–8.5)

## 2019-10-05 ENCOUNTER — Other Ambulatory Visit: Payer: Self-pay | Admitting: Physician Assistant

## 2019-10-05 DIAGNOSIS — F329 Major depressive disorder, single episode, unspecified: Secondary | ICD-10-CM

## 2019-10-05 DIAGNOSIS — F32A Depression, unspecified: Secondary | ICD-10-CM

## 2019-10-16 ENCOUNTER — Other Ambulatory Visit: Payer: Self-pay | Admitting: Nephrology

## 2019-10-16 DIAGNOSIS — E878 Other disorders of electrolyte and fluid balance, not elsewhere classified: Secondary | ICD-10-CM | POA: Insufficient documentation

## 2019-10-16 DIAGNOSIS — E872 Acidosis, unspecified: Secondary | ICD-10-CM | POA: Insufficient documentation

## 2019-10-16 DIAGNOSIS — N179 Acute kidney failure, unspecified: Secondary | ICD-10-CM

## 2019-10-23 ENCOUNTER — Ambulatory Visit
Admission: RE | Admit: 2019-10-23 | Discharge: 2019-10-23 | Disposition: A | Discharge status: Home / Self Care | Payer: 59 | Source: Ambulatory Visit | Attending: Nephrology | Admitting: Nephrology

## 2019-10-23 ENCOUNTER — Other Ambulatory Visit: Payer: Self-pay

## 2019-10-23 ENCOUNTER — Telehealth: Payer: Self-pay | Admitting: Physician Assistant

## 2019-10-23 DIAGNOSIS — E872 Acidosis, unspecified: Secondary | ICD-10-CM

## 2019-10-23 DIAGNOSIS — E878 Other disorders of electrolyte and fluid balance, not elsewhere classified: Secondary | ICD-10-CM | POA: Insufficient documentation

## 2019-10-23 DIAGNOSIS — N179 Acute kidney failure, unspecified: Secondary | ICD-10-CM | POA: Diagnosis present

## 2019-10-23 NOTE — Telephone Encounter (Signed)
Caller name: Desiree  Relation to pt: [employer] back number: [phone] fax # [phone]    Reason for call:  Patients  "RPR" came back positive and patient has to follow up with a "confirmatory test" to determine if original test was not a false negative. Specialist faxing over results to [phone], please note when received. Specialist requesting PCP to place lab order, please advise

## 2019-10-23 NOTE — Telephone Encounter (Signed)
If they're OK with it, I can place the order. If we can call to confirm they'd like a second RPR, I'll place it and the patient can come in 2-4 weeks from now and retest.

## 2019-10-23 NOTE — Telephone Encounter (Signed)
I see an office note from him but do not see the confirmatory letter

## 2019-10-30 ENCOUNTER — Other Ambulatory Visit: Payer: Self-pay

## 2019-10-30 DIAGNOSIS — A530 Latent syphilis, unspecified as early or late: Secondary | ICD-10-CM

## 2019-10-30 NOTE — Progress Notes (Signed)
Called the patient and ordered additional RPR per provider. Patient will pick up at reception at her convenience.

## 2019-11-07 ENCOUNTER — Telehealth: Payer: Self-pay

## 2019-11-07 ENCOUNTER — Other Ambulatory Visit: Payer: Self-pay

## 2019-11-07 DIAGNOSIS — A530 Latent syphilis, unspecified as early or late: Secondary | ICD-10-CM

## 2019-11-07 LAB — RPR: RPR Ser Ql: NONREACTIVE

## 2019-11-07 NOTE — Telephone Encounter (Signed)
Tiara from [facility] called to inquire on this patient.  She states that since she has 2 conflicting results on the RPR she would need to have this test done.  I have pended the test for your review.  It may be able to be added to the blood from yesterday, if not she will need to have it drawn again.  I have not spoken to the patient about any further testing.

## 2019-11-07 NOTE — Telephone Encounter (Signed)
If that's what the health department recommends it is fine by me. It was probably a false positive. I will sign and if the patient wants to get it she may.

## 2019-11-07 NOTE — Telephone Encounter (Signed)
Patient was advised that the lab was printed off.

## 2019-11-07 NOTE — Telephone Encounter (Signed)
Lab was send to [facility] for add on.

## 2019-11-10 LAB — FLUORESCENT TREPONEMAL AB(FTA)-IGG-BLD: Fluorescent Treponemal Ab, IgG: NONREACTIVE

## 2019-11-10 LAB — SPECIMEN STATUS REPORT

## 2019-11-16 ENCOUNTER — Encounter: Payer: Self-pay | Admitting: Physician Assistant

## 2019-11-16 ENCOUNTER — Ambulatory Visit (INDEPENDENT_AMBULATORY_CARE_PROVIDER_SITE_OTHER): Payer: 59 | Admitting: Physician Assistant

## 2019-11-16 ENCOUNTER — Other Ambulatory Visit: Payer: Self-pay

## 2019-11-16 VITALS — BP 128/92 | HR 95 | Temp 97.3°F | Wt 132.6 lb

## 2019-11-16 DIAGNOSIS — K5909 Other constipation: Secondary | ICD-10-CM

## 2019-11-16 DIAGNOSIS — F419 Anxiety disorder, unspecified: Secondary | ICD-10-CM | POA: Diagnosis not present

## 2019-11-16 DIAGNOSIS — R030 Elevated blood-pressure reading, without diagnosis of hypertension: Secondary | ICD-10-CM | POA: Diagnosis not present

## 2019-11-16 DIAGNOSIS — G43809 Other migraine, not intractable, without status migrainosus: Secondary | ICD-10-CM | POA: Diagnosis not present

## 2019-11-16 DIAGNOSIS — F329 Major depressive disorder, single episode, unspecified: Secondary | ICD-10-CM

## 2019-11-16 DIAGNOSIS — F32A Depression, unspecified: Secondary | ICD-10-CM

## 2019-11-16 MED ORDER — LINACLOTIDE 72 MCG PO CAPS: 72.0000 ug | ORAL_CAPSULE | Freq: Every day | ORAL | 0 refills | Status: DC

## 2019-11-16 NOTE — Patient Instructions (Addendum)

## 2019-11-16 NOTE — Progress Notes (Signed)
Patient: Margaret Waller Female    DOB: January 23, 1976   44 y.o.   MRN: [id number] Visit Date: 11/16/2019  Today's Provider: Trinna Post, PA-C   Chief Complaint  Patient presents with  . Anxiety  . Depression   Subjective:     HPI  Anxiety & Depression Patient presents today for anxiety and depression follow-up. Patient last office visit was on 07/07/2019. Patient is currently taking Wellbutrin 150 mg and reports good compliance. We have tried several medications prior to this and she feels this one works the best and that her mood has improved significantly. She is experiencing some constipation.   GAD 7 : Generalized Anxiety Score 11/16/2019  Nervous, Anxious, on Edge 2  Control/stop worrying 2  Worry too much - different things 1  Trouble relaxing 1  Restless 2  Easily annoyed or irritable 1  Afraid - awful might happen 1  Total GAD 7 Score 10  Anxiety Difficulty Somewhat difficult   Depression screen [facility] 2/9 11/16/2019 05/16/2019  Decreased Interest 1 2  Down, Depressed, Hopeless 0 1  PHQ - 2 Score 1 3  Altered sleeping 3 3  Tired, decreased energy 2 3  Change in appetite 2 3  Feeling bad or failure about yourself  0 1  Trouble concentrating 3 2  Moving slowly or fidgety/restless 2 1  Suicidal thoughts 0 0  PHQ-9 Score 13 16  Difficult doing work/chores Somewhat difficult Somewhat difficult   Constipation: Used to have bowel movement every day and now has one every 3 days. Has tried miralax and dulcolax, increased fiber.    BP Readings from Last 3 Encounters:  11/16/19 (!) 128/92  05/16/19 122/87  11/27/17 122/69    No Known Allergies   Current Outpatient Medications:  .  acetaminophen (TYLENOL) 500 MG tablet, Take by mouth., Disp: , Rfl:  .  buPROPion (WELLBUTRIN SR) 150 MG 12 hr tablet, TAKE ONE TABLET BY MOUTH DAILY FOR THREE DAYS. THEN TAKE ONE TABLET BY MOUTH TWICE DAILY THEREAFTER, Disp: 90 tablet, Rfl: 0 .  Cholecalciferol (VITAMIN D-1000 MAX  ST) 25 MCG (1000 UT) tablet, Take by mouth., Disp: , Rfl:  .  cyclobenzaprine (FLEXERIL) 10 MG tablet, Take by mouth., Disp: , Rfl:  .  Erenumab-aooe (AIMOVIG) 140 MG/ML SOAJ, Inject into the skin., Disp: , Rfl:  .  loratadine (CLARITIN) 10 MG tablet, Take 10 mg by mouth daily., Disp: , Rfl:  .  medroxyPROGESTERone Acetate 150 MG/ML SUSY, Inject into the muscle., Disp: , Rfl:  .  omeprazole (PRILOSEC) 20 MG capsule, Take 20 mg by mouth daily., Disp: , Rfl:  .  Vitamin D, Ergocalciferol, (DRISDOL) 1.25 MG (50000 UT) CAPS capsule, Take 1 capsule (50,000 Units total) by mouth every 7 (seven) days., Disp: 8 capsule, Rfl: 0 .  busPIRone (BUSPAR) 7.5 MG tablet, Take by mouth., Disp: , Rfl:  .  nortriptyline (PAMELOR) 50 MG capsule, Take by mouth., Disp: , Rfl:  .  sertraline (ZOLOFT) 50 MG tablet, , Disp: , Rfl:  .  Topiramate ER (TROKENDI XR) 200 MG CP24, Take by mouth., Disp: , Rfl:   Review of Systems  Social History   Tobacco Use  . Smoking status: Never Smoker  . Smokeless tobacco: Never Used  Substance Use Topics  . Alcohol use: Not Currently    Comment: rarely      Objective:   BP (!) 128/92 (BP Location: Left Arm, Patient Position: Sitting, Cuff Size: Normal)  Pulse 95   Temp (!) 97.3 F (36.3 C) (Temporal)   Wt 132 lb 9.6 oz (60.1 kg)   BMI 25.47 kg/m  Vitals:   11/16/19 0901  BP: (!) 128/92  Pulse: 95  Temp: (!) 97.3 F (36.3 C)  TempSrc: Temporal  Weight: 132 lb 9.6 oz (60.1 kg)  Body mass index is 25.47 kg/m.   Physical Exam Constitutional:      Appearance: Normal appearance.  Cardiovascular:     Rate and Rhythm: Normal rate and regular rhythm.     Heart sounds: Normal heart sounds.  Pulmonary:     Effort: Pulmonary effort is normal.     Breath sounds: Normal breath sounds.  Neurological:     Mental Status: She is alert and oriented to person, place, and time. Mental status is at baseline.  Psychiatric:        Mood and Affect: Mood normal.         Behavior: Behavior normal.      No results found for any visits on 11/16/19.     Assessment & Plan    1. Other migraine without status migrainosus, not intractable   2. Constipation, unspecified constipation type  Possibly due to wellbutrin. As she is doing well on this medication after several trials, will treat the constipation.   - linaclotide (LINZESS) 72 MCG capsule; Take 1 capsule (72 mcg total) by mouth daily before breakfast.  Dispense: 90 capsule; Refill: 0  3. Elevated BP without diagnosis of hypertension  Observe.  4. Anxiety and depression  The entirety of the information documented in the History of Present Illness, Review of Systems and Physical Exam were personally obtained by me. Portions of this information were initially documented by [facility] and reviewed by me for thoroughness and accuracy.        Trinna Post, PA-C  [facility]

## 2019-11-22 ENCOUNTER — Other Ambulatory Visit: Payer: Self-pay | Admitting: Physician Assistant

## 2019-11-22 DIAGNOSIS — F32A Depression, unspecified: Secondary | ICD-10-CM

## 2019-11-22 DIAGNOSIS — F329 Major depressive disorder, single episode, unspecified: Secondary | ICD-10-CM

## 2019-11-22 NOTE — Telephone Encounter (Signed)
Requested medication (s) are due for refill today -yes  Requested medication (s) are on the active medication list -yes  Future visit scheduled -no  Last refill: 11/27  Notes to clinic: Rx refill request for continuation of mew medication- needs # and Sig adjusted for refill continuation   Requested Prescriptions  Pending Prescriptions Disp Refills   buPROPion (WELLBUTRIN SR) 150 MG 12 hr tablet [Pharmacy Med Name: buPROPion HCl ER (SR) 150 MG Oral Tablet Extended Release 12 Hour] 90 tablet 0    Sig: TAKE ONE TABLET BY MOUTH DAILY FOR THREE DAYS. THEN TAKE ONE TABLET BY MOUTH TWICE DAILY THEREAFTER      Psychiatry: Antidepressants - bupropion Failed - 11/22/2019  9:43 AM      Failed - Last BP in normal range    BP Readings from Last 1 Encounters:  11/16/19 (!) 128/92          Passed - Valid encounter within last 6 months    Recent Outpatient Visits           6 days ago Other migraine without status migrainosus, not intractable   [facility] [place], Adriana M, PA-C   4 months ago Depression, unspecified depression type   [place], [place], PA-C   6 months ago Other migraine without status migrainosus, not intractable   [place], Adriana M, [place]                  Requested Prescriptions  Pending Prescriptions Disp Refills   buPROPion (WELLBUTRIN SR) 150 MG 12 hr tablet [Pharmacy Med Name: buPROPion HCl ER (SR) 150 MG Oral Tablet Extended Release 12 Hour] 90 tablet 0    Sig: TAKE ONE TABLET BY MOUTH DAILY FOR THREE DAYS. THEN TAKE ONE TABLET BY MOUTH TWICE DAILY THEREAFTER      Psychiatry: Antidepressants - bupropion Failed - 11/22/2019  9:43 AM      Failed - Last BP in normal range    BP Readings from Last 1 Encounters:  11/16/19 (!) 128/92          Passed - Valid encounter within last 6 months    Recent Outpatient Visits           6 days ago Other migraine without status migrainosus, not  intractable   [place], PA-C   4 months ago Depression, unspecified depression type   [place], [place], PA-C   6 months ago Other migraine without status migrainosus, not intractable   [place], [place], [place]

## 2019-11-27 ENCOUNTER — Encounter: Payer: Self-pay | Admitting: Physician Assistant

## 2019-12-06 ENCOUNTER — Telehealth: Payer: Self-pay | Admitting: Physician Assistant

## 2019-12-06 NOTE — Telephone Encounter (Signed)
[employer] [facility], called and is needing to know if the pt has chronic idiopathic constipation in order to approve the PA for the pts linzess. If so they are needing the medical records stating the diagnosis. Please advise.   Callback # [place] [phone]

## 2019-12-07 NOTE — Telephone Encounter (Signed)
Yes I updated the diagnosis code on the last note.

## 2019-12-08 NOTE — Telephone Encounter (Signed)
Spoke w/ [employer] and they stated that the only thing they was need was a verbal from [place] stating that the dx code was corrected so that the prior authorization  can get approved. The verbal was giving and the PA was approved for 12 months. Case approval number is [id number]

## 2019-12-15 ENCOUNTER — Encounter (INDEPENDENT_AMBULATORY_CARE_PROVIDER_SITE_OTHER): Payer: 59 | Admitting: Physician Assistant

## 2019-12-15 DIAGNOSIS — I10 Essential (primary) hypertension: Secondary | ICD-10-CM

## 2019-12-15 MED ORDER — AMLODIPINE BESYLATE 5 MG PO TABS: 5.0000 mg | ORAL_TABLET | Freq: Every day | ORAL | 0 refills | Status: DC

## 2019-12-15 NOTE — Telephone Encounter (Signed)
   Subjective:   Patient ID: Margaret Waller, female    DOB: September 08, 1976, 44 y.o.   MRN: [id number]  Margaret Waller is a 44 y.o. female presenting on 12/15/2019 for CC: high Blood Pressure.   HPI  Virtual Visit via Telephone Note  I connected with JEANETH RECKLING on 12/15/2019 at  3:40 PM by telephone and verified that I am speaking with the correct person using two identifiers.   I discussed the limitations, risks, security and privacy concerns of performing an evaluation and management service by telephone and the availability of in person appointments. I also discussed with the patient that there may be a patient responsible charge related to this service. The patient expressed understanding and agreed to proceed.   Patient presenting today with concern of high blood pressure. She has had some elevated readings in clinic and has been monitoring her readings recently. Reports all of them have systolic > 90. She has also been seen by nephrology who reports she has CKD and she needs to more closely control her BP. She has never been treated for hypertension previously.   BP Readings from Last 3 Encounters:  11/16/19 (!) 128/92  05/16/19 122/87  11/27/17 122/69      Social History   Tobacco Use  . Smoking status: Never Smoker  . Smokeless tobacco: Never Used  Substance Use Topics  . Alcohol use: Not Currently    Comment: rarely  . Drug use: No   ROS Per HPI unless specifically indicated above  Objective:   Wt Readings from Last 3 Encounters:  11/16/19 132 lb 9.6 oz (60.1 kg)  05/16/19 135 lb (61.2 kg)  11/27/17 114 lb (51.7 kg)    Physical Exam Results for orders placed or performed in visit on 10/30/19  RPR  Result Value Ref Range   RPR Ser Ql Non Reactive Non Reactive  Fluorescent treponemal ab(fta)-IgG-bld  Result Value Ref Range   Fluorescent Treponemal Ab, IgG Non Reactive Non Reactive  Specimen status report  Result Value Ref Range   specimen status  report Comment     Assessment & Plan:  1. Essential hypertension  Follow up one month.   - amLODipine (NORVASC) 5 MG tablet; Take 1 tablet (5 mg total) by mouth daily.  Dispense: 90 tablet; Refill: [place], PA-C [facility] 12/15/2019, 3:55 PM

## 2019-12-15 NOTE — Telephone Encounter (Signed)
Patient schedule appointment for 01/12/2020 @ 9:40 AM.

## 2020-01-12 ENCOUNTER — Encounter: Payer: Self-pay | Admitting: Physician Assistant

## 2020-01-12 ENCOUNTER — Ambulatory Visit (INDEPENDENT_AMBULATORY_CARE_PROVIDER_SITE_OTHER): Payer: 59 | Admitting: Physician Assistant

## 2020-01-12 ENCOUNTER — Other Ambulatory Visit: Payer: Self-pay

## 2020-01-12 VITALS — BP 120/80 | HR 88 | Temp 96.9°F | Wt 129.6 lb

## 2020-01-12 DIAGNOSIS — I10 Essential (primary) hypertension: Secondary | ICD-10-CM

## 2020-01-12 DIAGNOSIS — K5904 Chronic idiopathic constipation: Secondary | ICD-10-CM

## 2020-01-12 DIAGNOSIS — K5909 Other constipation: Secondary | ICD-10-CM

## 2020-01-12 MED ORDER — AMLODIPINE BESYLATE 10 MG PO TABS: 10.0000 mg | ORAL_TABLET | Freq: Every day | ORAL | 0 refills | Status: DC

## 2020-01-12 MED ORDER — LINACLOTIDE 145 MCG PO CAPS: 145.0000 ug | ORAL_CAPSULE | Freq: Every day | ORAL | 1 refills | Status: AC

## 2020-01-12 NOTE — Patient Instructions (Signed)

## 2020-01-12 NOTE — Progress Notes (Signed)
Patient: Margaret Waller Female    DOB: August 17, 1976   44 y.o.   MRN: [id number] Visit Date: 01/12/2020  Today's Provider: Trinna Post, PA-C   Chief Complaint  Patient presents with  . Follow-up   Subjective:     HPI   Follow-up Patient presents today for follow-up visit for blood pressure. Patient reports that her blood pressure has range from 120's-130's/80s-90s. She was started on amlodipine 5 mg QD last visit and is taking this without issue. She is followed by nephrology for AKI vs. CKD.     No Known Allergies   Current Outpatient Medications:  .  acetaminophen (TYLENOL) 500 MG tablet, Take by mouth., Disp: , Rfl:  .  amLODipine (NORVASC) 5 MG tablet, Take 1 tablet (5 mg total) by mouth daily., Disp: 90 tablet, Rfl: 0 .  buPROPion (WELLBUTRIN SR) 150 MG 12 hr tablet, TAKE ONE TABLET BY MOUTH DAILY FOR THREE DAYS. THEN TAKE ONE TABLET BY MOUTH TWICE DAILY THEREAFTER, Disp: 90 tablet, Rfl: 0 .  busPIRone (BUSPAR) 7.5 MG tablet, Take by mouth., Disp: , Rfl:  .  Cholecalciferol (VITAMIN D-1000 MAX ST) 25 MCG (1000 UT) tablet, Take by mouth., Disp: , Rfl:  .  cyclobenzaprine (FLEXERIL) 10 MG tablet, Take by mouth., Disp: , Rfl:  .  Erenumab-aooe (AIMOVIG) 140 MG/ML SOAJ, Inject into the skin., Disp: , Rfl:  .  linaclotide (LINZESS) 72 MCG capsule, Take 1 capsule (72 mcg total) by mouth daily before breakfast., Disp: 90 capsule, Rfl: 0 .  loratadine (CLARITIN) 10 MG tablet, Take 10 mg by mouth daily., Disp: , Rfl:  .  medroxyPROGESTERone Acetate 150 MG/ML SUSY, Inject into the muscle., Disp: , Rfl:  .  omeprazole (PRILOSEC) 20 MG capsule, Take 20 mg by mouth daily., Disp: , Rfl:  .  Vitamin D, Ergocalciferol, (DRISDOL) 1.25 MG (50000 UT) CAPS capsule, Take 1 capsule (50,000 Units total) by mouth every 7 (seven) days., Disp: 8 capsule, Rfl: 0 .  nortriptyline (PAMELOR) 50 MG capsule, Take by mouth., Disp: , Rfl:  .  Topiramate ER (TROKENDI XR) 200 MG CP24, Take by  mouth., Disp: , Rfl:   Review of Systems  Respiratory: Negative for shortness of breath.   Cardiovascular: Negative for chest pain, palpitations and leg swelling.  Neurological: Negative for weakness and headaches.  Hematological: Does not bruise/bleed easily.    Social History   Tobacco Use  . Smoking status: Never Smoker  . Smokeless tobacco: Never Used  Substance Use Topics  . Alcohol use: Not Currently    Comment: rarely      Objective:   BP 120/80 (BP Location: Right Arm, Patient Position: Sitting, Cuff Size: Normal)   Pulse 88   Temp (!) 96.9 F (36.1 C) (Temporal)   Wt 129 lb 9.6 oz (58.8 kg)   SpO2 97%   BMI 24.89 kg/m  Vitals:   01/12/20 0943  BP: 120/80  Pulse: 88  Temp: (!) 96.9 F (36.1 C)  TempSrc: Temporal  SpO2: 97%  Weight: 129 lb 9.6 oz (58.8 kg)  Body mass index is 24.89 kg/m.   Physical Exam Constitutional:      Appearance: Normal appearance.  Cardiovascular:     Rate and Rhythm: Normal rate and regular rhythm.     Heart sounds: Normal heart sounds.  Pulmonary:     Effort: Pulmonary effort is normal.     Breath sounds: Normal breath sounds.  Skin:    General: Skin is  warm and dry.  Neurological:     Mental Status: She is alert and oriented to person, place, and time. Mental status is at baseline.  Psychiatric:        Mood and Affect: Mood normal.        Behavior: Behavior normal.      No results found for any visits on 01/12/20.     Assessment & Plan    1. Essential hypertension  We will increase amlodipine from 5 mg QD to 10 mg QD due to elevated diastolic readings on her home monitoring. We will recheck CMET as below. She has multiple visits with OBGYN, neurology and also her nephrologist upcoming over the next few months where her BP will be checked. I think these are good touch points and if her BP is uncontrolled she may always come back sooner. If her BP decreases then we will follow once yearly.   - amLODipine (NORVASC)  10 MG tablet; Take 1 tablet (10 mg total) by mouth daily.  Dispense: 90 tablet; Refill: 0 - Comprehensive Metabolic Panel (CMET)  2. Chronic constipation   3. Chronic idiopathic constipation  - linaclotide (LINZESS) 145 MCG CAPS capsule; Take 1 capsule (145 mcg total) by mouth daily before breakfast.  Dispense: 90 capsule; Refill: 1  The entirety of the information documented in the History of Present Illness, Review of Systems and Physical Exam were personally obtained by me. Portions of this information were initially documented by [facility] and reviewed by me for thoroughness and accuracy.       Trinna Post, PA-C  [facility]

## 2020-01-13 LAB — COMPREHENSIVE METABOLIC PANEL
ALT: 28 IU/L (ref 0–32)
AST: 16 IU/L (ref 0–40)
Albumin/Globulin Ratio: 2.1 (ref 1.2–2.2)
Albumin: 5 g/dL — ABNORMAL HIGH (ref 3.8–4.8)
Alkaline Phosphatase: 78 IU/L (ref 39–117)
BUN/Creatinine Ratio: 14 (ref 9–23)
BUN: 14 mg/dL (ref 6–24)
Bilirubin Total: 0.5 mg/dL (ref 0.0–1.2)
CO2: 21 mmol/L (ref 20–29)
Calcium: 9.9 mg/dL (ref 8.7–10.2)
Chloride: 103 mmol/L (ref 96–106)
Creatinine, Ser: 1.01 mg/dL — ABNORMAL HIGH (ref 0.57–1.00)
GFR calc Af Amer: 78 mL/min/{1.73_m2} (ref >59)
GFR calc non Af Amer: 68 mL/min/{1.73_m2} (ref >59)
Globulin, Total: 2.4 g/dL (ref 1.5–4.5)
Glucose: 94 mg/dL (ref 65–99)
Potassium: 3.9 mmol/L (ref 3.5–5.2)
Sodium: 140 mmol/L (ref 134–144)
Total Protein: 7.4 g/dL (ref 6.0–8.5)

## 2020-01-16 ENCOUNTER — Other Ambulatory Visit: Payer: Self-pay | Admitting: Physician Assistant

## 2020-01-16 DIAGNOSIS — F329 Major depressive disorder, single episode, unspecified: Secondary | ICD-10-CM

## 2020-01-16 DIAGNOSIS — F32A Depression, unspecified: Secondary | ICD-10-CM

## 2020-01-16 NOTE — Telephone Encounter (Signed)
Requested Prescriptions  Pending Prescriptions Disp Refills  . buPROPion (WELLBUTRIN SR) 150 MG 12 hr tablet [Pharmacy Med Name: buPROPion HCl ER (SR) 150 MG Oral Tablet Extended Release 12 Hour] 90 tablet 1    Sig: TAKE ONE TABLET BY MOUTH DAILY FOR THREE DAYS. THEN TAKE ONE TABLET BY MOUTH TWICE DAILY THEREAFTER     Psychiatry: Antidepressants - bupropion Passed - 01/16/2020  7:52 PM      Passed - Last BP in normal range    BP Readings from Last 1 Encounters:  01/12/20 120/80         Passed - Valid encounter within last 6 months    Recent Outpatient Visits          4 days ago Essential hypertension   [place], Adriana M, PA-C   2 months ago Other migraine without status migrainosus, not intractable   [place], [place], PA-C   6 months ago Depression, unspecified depression type   [place], [place], PA-C   8 months ago Other migraine without status migrainosus, not intractable   [place], [place], [place]

## 2020-02-15 ENCOUNTER — Encounter: Payer: Self-pay | Admitting: Physician Assistant

## 2020-02-22 ENCOUNTER — Telehealth (INDEPENDENT_AMBULATORY_CARE_PROVIDER_SITE_OTHER): Payer: 59 | Admitting: Physician Assistant

## 2020-02-22 ENCOUNTER — Ambulatory Visit: Payer: 59 | Admitting: Physician Assistant

## 2020-02-22 VITALS — BP 120/78

## 2020-02-22 DIAGNOSIS — I10 Essential (primary) hypertension: Secondary | ICD-10-CM | POA: Diagnosis not present

## 2020-02-22 DIAGNOSIS — R600 Localized edema: Secondary | ICD-10-CM | POA: Diagnosis not present

## 2020-02-22 MED ORDER — LOSARTAN POTASSIUM-HCTZ 50-12.5 MG PO TABS: 1.0000 | ORAL_TABLET | Freq: Every day | ORAL | 0 refills | Status: DC

## 2020-02-22 NOTE — Patient Instructions (Signed)

## 2020-02-22 NOTE — Progress Notes (Signed)
MyChart Video Visit     Virtual Visit via Video Note   This visit type was conducted due to national recommendations for restrictions regarding the COVID-19 Pandemic (e.g. social distancing) in an effort to limit this patient's exposure and mitigate transmission in our community. This patient is at least at moderate risk for complications without adequate follow up. This format is felt to be most appropriate for this patient at this time. Physical exam was limited by quality of the video and audio technology used for the visit.   Patient location: home Provider location: office   Patient: Margaret Waller   DOB: January 07, 1976   44 y.o. Female  MRN: [id number] Visit Date: 02/22/2020  Today's Provider: Trinna Post, PA-C  Subjective:    Chief Complaint  Patient presents with  . Edema   HPI  Edema: Patient has been having swelling in her lower extremities bilaterally for 1 week duration.  There is no pain, redness, blistering or lesions. She has had the swelling in her feet and up to her knees last week, today just her feet and ankles. It is worse in the evening and by morning the swelling is down, but not to normall size.  She is on Amlodipine.  No known history of trauma to either leg or foot. She is having some slightly more swelling in her right leg than the left.   Hypertension, follow-up  BP Readings from Last 3 Encounters:  02/22/20 120/78  01/12/20 120/80  11/16/19 (!) 128/92   Wt Readings from Last 3 Encounters:  01/12/20 129 lb 9.6 oz (58.8 kg)  11/16/19 132 lb 9.6 oz (60.1 kg)  05/16/19 135 lb (61.2 kg)   She was last seen for hypertension 1 months ago.  BP at that visit was normal. Management since that visit includes continue amlodipine 10 mg daily. She reports excellent compliance with treatment. She is having side effects. She is having some lower extremity swelling. She is exercising. She is adherent to low salt diet.   Outside blood pressures are  normal. Symptoms: No chest pain No chest pressure/discomfort No dyspnea (difficulty breathing) Yes lower extremity edema No orthopnea No palpitations No paroxysmal nocturnal dyspnea  No syncope  She does not smoke. She is following a Regular diet. Use of agents associated with hypertension: none.   Last basic metabolic panel Lab Results  Component Value Date   GLUCOSE 94 01/12/2020   NA 140 01/12/2020   K 3.9 01/12/2020   CL 103 01/12/2020   CO2 21 01/12/2020   BUN 14 01/12/2020   CREATININE 1.01 (H) 01/12/2020   GFRNONAA 68 01/12/2020   GFRAA 78 01/12/2020   CALCIUM 9.9 01/12/2020   Last lipids Lab Results  Component Value Date   CHOL 167 05/16/2019   HDL 58 05/16/2019   LDLCALC 95 05/16/2019   TRIG 68 05/16/2019   CHOLHDL 2.9 05/16/2019    The 10-year ASCVD risk score Mikey Bussing DC Jr., et al., 2013) is: 0.6%   -----------------------------------------------------------------------------------------      Medications: Outpatient Medications Prior to Visit  Medication Sig  . acetaminophen (TYLENOL) 500 MG tablet Take by mouth.  Marland Kitchen amLODipine (NORVASC) 10 MG tablet Take 1 tablet (10 mg total) by mouth daily.  Marland Kitchen buPROPion (WELLBUTRIN SR) 150 MG 12 hr tablet TAKE ONE TABLET BY MOUTH DAILY FOR THREE DAYS. THEN TAKE ONE TABLET BY MOUTH TWICE DAILY THEREAFTER  . busPIRone (BUSPAR) 7.5 MG tablet Take by mouth.  . Cholecalciferol (VITAMIN D-1000 MAX ST)  25 MCG (1000 UT) tablet Take by mouth.  . cyclobenzaprine (FLEXERIL) 10 MG tablet Take by mouth.  Eduard Roux (AIMOVIG) 140 MG/ML SOAJ Inject into the skin.  Marland Kitchen linaclotide (LINZESS) 145 MCG CAPS capsule Take 1 capsule (145 mcg total) by mouth daily before breakfast.  . loratadine (CLARITIN) 10 MG tablet Take 10 mg by mouth daily.  . medroxyPROGESTERone Acetate 150 MG/ML SUSY Inject into the muscle.  . nortriptyline (PAMELOR) 50 MG capsule Take by mouth.  Marland Kitchen omeprazole (PRILOSEC) 20 MG capsule Take 20 mg by mouth daily.   . Topiramate ER (TROKENDI XR) 200 MG CP24 Take by mouth.  . Vitamin D, Ergocalciferol, (DRISDOL) 1.25 MG (50000 UT) CAPS capsule Take 1 capsule (50,000 Units total) by mouth every 7 (seven) days.   No facility-administered medications prior to visit.      Review of Systems  Respiratory: Negative for shortness of breath.   Cardiovascular: Positive for leg swelling. Negative for chest pain.  Neurological: Positive for headaches (chronic and unchanfed). Negative for dizziness.        Objective:    BP 120/78    Physical Exam       Assessment & Plan:    1. Essential hypertension  Well controlled, however patient having side effects of BLE edema. Reduce amlodipine to 5 mg QD. Start losartan-hctz below. Counseled on risk of angioedema. Recheck metabolic panel in one month. F/u in 1 months  - losartan-hydrochlorothiazide (HYZAAR) 50-12.5 MG tablet; Take 1 tablet by mouth daily.  Dispense: 90 tablet; Refill: 0  2. Lower extremity edema  - losartan-hydrochlorothiazide (HYZAAR) 50-12.5 MG tablet; Take 1 tablet by mouth daily.  Dispense: 90 tablet; Refill: 0    Return in about 1 month (around 03/23/2020).     I discussed the assessment and treatment plan with the patient. The patient was provided an opportunity to ask questions and all were answered. The patient agreed with the plan and demonstrated an understanding of the instructions.   The patient was advised to call back or seek an in-person evaluation if the symptoms worsen or if the condition fails to improve as anticipated.  The entirety of the information documented in the History of Present Illness, Review of Systems and Physical Exam were personally obtained by me. Portions of this information were initially documented by Idelle Jo, CMA and reviewed by me for thoroughness and accuracy.       Paulene Floor [facility] [phone] (phone) [phone] (fax)  [place]

## 2020-02-25 ENCOUNTER — Encounter: Payer: Self-pay | Admitting: Physician Assistant

## 2020-02-25 DIAGNOSIS — K219 Gastro-esophageal reflux disease without esophagitis: Secondary | ICD-10-CM

## 2020-02-25 DIAGNOSIS — J301 Allergic rhinitis due to pollen: Secondary | ICD-10-CM

## 2020-02-26 MED ORDER — CETIRIZINE HCL 10 MG PO TABS: 10.0000 mg | ORAL_TABLET | Freq: Every day | ORAL | 1 refills | Status: DC

## 2020-02-26 MED ORDER — OMEPRAZOLE 20 MG PO CPDR: 20.0000 mg | DELAYED_RELEASE_CAPSULE | Freq: Every day | ORAL | 1 refills | Status: DC

## 2020-03-21 NOTE — Progress Notes (Signed)
Established patient visit   Patient: Margaret Waller   DOB: [birth year]   44 y.o. Female  MRN: [id number] Visit Date: 03/22/2020  Today's healthcare provider: Trinna Post, PA-C   Chief Complaint  Patient presents with  . Hypertension  I,Kaylise Blakeley M Jah Alarid,acting as a scribe for [employer], PA-C.,have documented all relevant documentation on the behalf of Trinna Post, PA-C,as directed by  Trinna Post, PA-C while in the presence of Trinna Post, PA-C.  Subjective    HPI Hypertension, follow-up  BP Readings from Last 3 Encounters:  03/22/20 132/76  02/22/20 120/78  01/12/20 120/80   Wt Readings from Last 3 Encounters:  03/22/20 134 lb (60.8 kg)  01/12/20 129 lb 9.6 oz (58.8 kg)  11/16/19 132 lb 9.6 oz (60.1 kg)     She was last seen for hypertension 1 months ago.  BP at that visit was patient reported 120/78. Management since that visit includes reduced Amlodipine to 5 mg QD and was started on Losartan-HCTZ.  She reports good compliance with treatment. She is not having side effects. Patient reports 2 weeks ago after driving for 4hours straight she had ankle and feet swelling. She is following a healthy diet. She is exercising. She does not smoke.  Use of agents associated with hypertension: none.   Outside blood pressures are 110's/80's-90's. Symptoms: No chest pain No chest pressure  No palpitations No syncope  No dyspnea No orthopnea  No paroxysmal nocturnal dyspnea No lower extremity edema   Pertinent labs: Lab Results  Component Value Date   CHOL 167 05/16/2019   HDL 58 05/16/2019   LDLCALC 95 05/16/2019   TRIG 68 05/16/2019   CHOLHDL 2.9 05/16/2019   Lab Results  Component Value Date   NA 140 01/12/2020   K 3.9 01/12/2020   CREATININE 1.01 (H) 01/12/2020   GFRNONAA 68 01/12/2020   GFRAA 78 01/12/2020   GLUCOSE 94 01/12/2020     The 10-year ASCVD risk score Mikey Bussing DC Jr., et al., 2013) is: 0.7%    ---------------------------------------------------------------------------------------------------     Medications: Outpatient Medications Prior to Visit  Medication Sig Note  . acetaminophen (TYLENOL) 500 MG tablet Take by mouth.   Marland Kitchen amLODipine (NORVASC) 10 MG tablet Take 1 tablet (10 mg total) by mouth daily. 03/22/2020: Only taking 5 mg  . buPROPion (WELLBUTRIN SR) 150 MG 12 hr tablet TAKE ONE TABLET BY MOUTH DAILY FOR THREE DAYS. THEN TAKE ONE TABLET BY MOUTH TWICE DAILY THEREAFTER   . cetirizine (ZYRTEC) 10 MG tablet Take 1 tablet (10 mg total) by mouth daily.   . Cholecalciferol (VITAMIN D-1000 MAX ST) 25 MCG (1000 UT) tablet Take by mouth.   . cyclobenzaprine (FLEXERIL) 10 MG tablet Take by mouth.   Eduard Roux (AIMOVIG) 140 MG/ML SOAJ Inject into the skin.   Marland Kitchen linaclotide (LINZESS) 145 MCG CAPS capsule Take 1 capsule (145 mcg total) by mouth daily before breakfast.   . losartan-hydrochlorothiazide (HYZAAR) 50-12.5 MG tablet Take 1 tablet by mouth daily.   . medroxyPROGESTERone Acetate 150 MG/ML SUSY Inject into the muscle.   Marland Kitchen omeprazole (PRILOSEC) 20 MG capsule Take 1 capsule (20 mg total) by mouth daily.   . Vitamin D, Ergocalciferol, (DRISDOL) 1.25 MG (50000 UT) CAPS capsule Take 1 capsule (50,000 Units total) by mouth every 7 (seven) days.   . busPIRone (BUSPAR) 7.5 MG tablet Take by mouth.   . nortriptyline (PAMELOR) 50 MG capsule Take by mouth.   . Topiramate  ER (TROKENDI XR) 200 MG CP24 Take by mouth.    No facility-administered medications prior to visit.    Review of Systems  Constitutional: Negative.   Respiratory: Negative.   Cardiovascular: Negative.   Hematological: Negative.       Objective    BP 132/76 (BP Location: Left Arm, Patient Position: Sitting, Cuff Size: Normal)   Pulse 97   Temp (!) 97.1 F (36.2 C) (Temporal)   Wt 134 lb (60.8 kg)   SpO2 99%   BMI 25.74 kg/m    Physical Exam Constitutional:      Appearance: Normal appearance.   Cardiovascular:     Rate and Rhythm: Normal rate and regular rhythm.     Heart sounds: Normal heart sounds.  Pulmonary:     Effort: Pulmonary effort is normal.     Breath sounds: Normal breath sounds.  Skin:    General: Skin is warm and dry.  Neurological:     Mental Status: She is alert and oriented to person, place, and time. Mental status is at baseline.  Psychiatric:        Mood and Affect: Mood normal.        Behavior: Behavior normal.       No results found for any visits on 03/22/20.  Assessment & Plan    1. Essential hypertension  Well controlled Continue current medications Recheck metabolic panel F/u in 6 months  - Comprehensive metabolic panel    Return in about 6 months (around 09/22/2020) for htn.      ITrinna Post, PA-C, have reviewed all documentation for this visit. The documentation on 03/22/20 for the exam, diagnosis, procedures, and orders are all accurate and complete.    Paulene Floor  [facility] [phone] (phone) [phone] (fax)  [place]

## 2020-03-22 ENCOUNTER — Ambulatory Visit (INDEPENDENT_AMBULATORY_CARE_PROVIDER_SITE_OTHER): Payer: 59 | Admitting: Physician Assistant

## 2020-03-22 ENCOUNTER — Encounter: Payer: Self-pay | Admitting: Physician Assistant

## 2020-03-22 ENCOUNTER — Other Ambulatory Visit: Payer: Self-pay

## 2020-03-22 VITALS — BP 132/76 | HR 97 | Temp 97.1°F | Wt 134.0 lb

## 2020-03-22 DIAGNOSIS — I10 Essential (primary) hypertension: Secondary | ICD-10-CM

## 2020-03-22 NOTE — Patient Instructions (Signed)

## 2020-03-23 LAB — COMPREHENSIVE METABOLIC PANEL
ALT: 18 IU/L (ref 0–32)
AST: 20 IU/L (ref 0–40)
Albumin/Globulin Ratio: 2.1 (ref 1.2–2.2)
Albumin: 5 g/dL — ABNORMAL HIGH (ref 3.8–4.8)
Alkaline Phosphatase: 94 IU/L (ref 39–117)
BUN/Creatinine Ratio: 16 (ref 9–23)
BUN: 18 mg/dL (ref 6–24)
Bilirubin Total: 0.4 mg/dL (ref 0.0–1.2)
CO2: 23 mmol/L (ref 20–29)
Calcium: 10.1 mg/dL (ref 8.7–10.2)
Chloride: 101 mmol/L (ref 96–106)
Creatinine, Ser: 1.13 mg/dL — ABNORMAL HIGH (ref 0.57–1.00)
GFR calc Af Amer: 68 mL/min/{1.73_m2} (ref >59)
GFR calc non Af Amer: 59 mL/min/{1.73_m2} — ABNORMAL LOW (ref >59)
Globulin, Total: 2.4 g/dL (ref 1.5–4.5)
Glucose: 87 mg/dL (ref 65–99)
Potassium: 4 mmol/L (ref 3.5–5.2)
Sodium: 139 mmol/L (ref 134–144)
Total Protein: 7.4 g/dL (ref 6.0–8.5)

## 2020-03-30 ENCOUNTER — Other Ambulatory Visit: Payer: Self-pay | Admitting: Physician Assistant

## 2020-03-30 DIAGNOSIS — I10 Essential (primary) hypertension: Secondary | ICD-10-CM

## 2020-04-04 ENCOUNTER — Other Ambulatory Visit: Payer: Self-pay | Admitting: Physician Assistant

## 2020-04-04 ENCOUNTER — Other Ambulatory Visit: Payer: Self-pay

## 2020-04-04 ENCOUNTER — Encounter: Payer: Self-pay | Admitting: Physician Assistant

## 2020-04-04 DIAGNOSIS — I10 Essential (primary) hypertension: Secondary | ICD-10-CM

## 2020-04-04 MED ORDER — AMLODIPINE BESYLATE 5 MG PO TABS: 5.0000 mg | ORAL_TABLET | Freq: Every day | ORAL | 1 refills | Status: DC

## 2020-04-04 NOTE — Telephone Encounter (Signed)
We did decrease to amlodipine 5 mg QD. I have sent in #90 with one refill.

## 2020-04-04 NOTE — Telephone Encounter (Signed)
Patient send a MyChart request to send in Amlodipine 5 mg but I only see 10 mg in the chart. Please advise?

## 2020-04-05 ENCOUNTER — Other Ambulatory Visit: Payer: Self-pay | Admitting: Physician Assistant

## 2020-04-05 DIAGNOSIS — F32A Depression, unspecified: Secondary | ICD-10-CM

## 2020-04-17 ENCOUNTER — Encounter: Payer: Self-pay | Admitting: Physician Assistant

## 2020-04-19 NOTE — Progress Notes (Signed)
Established patient visit   Patient: Margaret Waller   DOB: [birth year]   44 y.o. Female  MRN: [id number] Visit Date: 04/22/2020  Today's healthcare provider: Mar Daring, PA-C   Chief Complaint  Patient presents with  . Leg Swelling  . Finger Swelling  . Knee Pain  I,Porsha C McClurkin,acting as a scribe for [employer], PA-C.,have documented all relevant documentation on the behalf of CANA MIGNANO, PA-C,as directed by  Mar Daring, PA-C while in the presence of Mar Daring, PA-C.  Subjective    Knee Pain  The incident occurred more than 1 week ago. There was no injury mechanism. The pain is present in the right knee and left knee. The quality of the pain is described as aching and burning. The pain is at a severity of 5/10. The pain is mild. The pain has been intermittent since onset. Pertinent negatives include no inability to bear weight, numbness or tingling. She reports no foreign bodies present. Nothing aggravates the symptoms. She has tried non-weight bearing for the symptoms. The treatment provided mild relief.  Patient presents today for leg, knee, and finger swelling since April 2021. Patient states after Thursday, 04/17/2020 she stopped walking on the treadmill and going outside to see if the swelling will go down. This did not help.   Patient Active Problem List   Diagnosis Date Noted  . Acute kidney failure, unspecified ([place]) 10/16/2019  . Hyperchloremia 10/16/2019  . Metabolic acidosis 32/35/5732  . Complicated migraine 20/25/4270   Past Medical History:  Diagnosis Date  . Anxiety   . Cancer ([place])    skin  . Depression        Medications: Outpatient Medications Prior to Visit  Medication Sig  . acetaminophen (TYLENOL) 500 MG tablet Take by mouth.  Marland Kitchen amLODipine (NORVASC) 5 MG tablet Take 1 tablet (5 mg total) by mouth daily.  Marland Kitchen buPROPion (WELLBUTRIN SR) 150 MG 12 hr tablet TAKE ONE TABLET BY MOUTH DAILY FOR THREE  DAYS. THEN TAKE ONE TABLET BY MOUTH TWICE DAILY THEREAFTER  . busPIRone (BUSPAR) 7.5 MG tablet Take by mouth.  . cetirizine (ZYRTEC) 10 MG tablet Take 1 tablet (10 mg total) by mouth daily.  . Cholecalciferol (VITAMIN D-1000 MAX ST) 25 MCG (1000 UT) tablet Take by mouth.  . cyclobenzaprine (FLEXERIL) 10 MG tablet Take by mouth.  Eduard Roux (AIMOVIG) 140 MG/ML SOAJ Inject into the skin.  Marland Kitchen linaclotide (LINZESS) 145 MCG CAPS capsule Take 1 capsule (145 mcg total) by mouth daily before breakfast.  . losartan-hydrochlorothiazide (HYZAAR) 50-12.5 MG tablet Take 1 tablet by mouth daily.  . medroxyPROGESTERone Acetate 150 MG/ML SUSY Inject into the muscle.  Marland Kitchen omeprazole (PRILOSEC) 20 MG capsule Take 1 capsule (20 mg total) by mouth daily.  Marland Kitchen topiramate (TOPAMAX) 100 MG tablet Take by mouth.  . Vitamin D, Ergocalciferol, (DRISDOL) 1.25 MG (50000 UT) CAPS capsule Take 1 capsule (50,000 Units total) by mouth every 7 (seven) days.  . nortriptyline (PAMELOR) 50 MG capsule Take by mouth.  . Topiramate ER (TROKENDI XR) 200 MG CP24 Take by mouth.   No facility-administered medications prior to visit.    Review of Systems  Constitutional: Negative.   Respiratory: Negative.   Cardiovascular: Positive for leg swelling.  Neurological: Negative for tingling and numbness.  Hematological: Negative.     Last CBC Lab Results  Component Value Date   WBC 6.5 05/16/2019   HGB 14.0 05/16/2019   HCT 41.6 05/16/2019  MCV 84 05/16/2019   MCH 28.3 05/16/2019   RDW 13.3 05/16/2019   PLT 270 93/71/6967   Last metabolic panel Lab Results  Component Value Date   GLUCOSE 87 03/22/2020   NA 139 03/22/2020   K 4.0 03/22/2020   CL 101 03/22/2020   CO2 23 03/22/2020   BUN 18 03/22/2020   CREATININE 1.13 (H) 03/22/2020   GFRNONAA 59 (L) 03/22/2020   GFRAA 68 03/22/2020   CALCIUM 10.1 03/22/2020   PROT 7.4 03/22/2020   ALBUMIN 5.0 (H) 03/22/2020   LABGLOB 2.4 03/22/2020   AGRATIO 2.1 03/22/2020    BILITOT 0.4 03/22/2020   ALKPHOS 94 03/22/2020   AST 20 03/22/2020   ALT 18 03/22/2020   ANIONGAP 11 11/27/2017      Objective    BP 122/84 (BP Location: Left Arm, Patient Position: Sitting, Cuff Size: Normal)   Pulse 93   Temp (!) 96.9 F (36.1 C) (Temporal)   Wt 135 lb 6.4 oz (61.4 kg)   SpO2 97%   BMI 26.01 kg/m  BP Readings from Last 3 Encounters:  04/22/20 122/84  03/22/20 132/76  02/22/20 120/78   Wt Readings from Last 3 Encounters:  04/22/20 135 lb 6.4 oz (61.4 kg)  03/22/20 134 lb (60.8 kg)  01/12/20 129 lb 9.6 oz (58.8 kg)      Physical Exam Constitutional:      General: She is not in acute distress.    Appearance: Normal appearance. She is normal weight. She is not ill-appearing.  Cardiovascular:     Pulses: Normal pulses.  Musculoskeletal:     Right knee: No swelling, effusion, erythema, bony tenderness or crepitus. Normal range of motion. Tenderness present over the patellar tendon. No LCL laxity, MCL laxity, ACL laxity or PCL laxity. Normal alignment, normal meniscus and normal patellar mobility. Normal pulse.     Instability Tests: Anterior drawer test negative. Posterior drawer test negative. Anterior Lachman test negative. Medial McMurray test negative and lateral McMurray test negative.     Left knee: No swelling, effusion, erythema, bony tenderness or crepitus. Normal range of motion. Tenderness present over the patellar tendon. No LCL laxity, MCL laxity, ACL laxity or PCL laxity.Normal alignment, normal meniscus and normal patellar mobility. Normal pulse.     Instability Tests: Anterior drawer test negative. Posterior drawer test negative. Anterior Lachman test negative. Medial McMurray test negative and lateral McMurray test negative.     Right lower leg: Edema (trace) present.     Left lower leg: Edema (trace) present.  Skin:    General: Skin is warm and dry.     Capillary Refill: Capillary refill takes less than 2 seconds.  Neurological:      General: No focal deficit present.     Mental Status: She is alert and oriented to person, place, and time. Mental status is at baseline.  Psychiatric:        Mood and Affect: Mood normal.        Behavior: Behavior normal.        Thought Content: Thought content normal.        Judgment: Judgment normal.      No results found for any visits on 04/22/20.  Assessment & Plan     1. Essential hypertension Will stop amlodipine completely to see if that helps the swelling. Will increase losartan-HCTZ as below. F/U with Carles Collet, PAC in 2 weeks for BP recheck and see if swelling has improved/resolved.  - losartan-hydrochlorothiazide (HYZAAR) 100-25 MG tablet; Take  1 tablet by mouth daily.  Dispense: 90 tablet; Refill: 3  2. Swelling of both lower extremities Labs have been normal/unremarkable. Reviewed from nephrologist. See above medical treatment plan.  3. Swelling of both upper extremities See above medical treatment plan.  4. Weight gain Patient also notes hair loss and weight gain/difficulty losing weight despite a calorie restricted diet and exercise. Will check thyroid as below. Will f/u pending results. If normal, discussed also possible patient may be having perimenopausal symptoms. Patient is on depo-provera so unknown menstrual status at this time. May consider further evaluation if symptoms continue and labs normal.  - T4 AND TSH  5. Hair loss See above medical treatment plan. - T4 AND TSH  6. Chronic fatigue See above medical treatment plan. - T4 AND TSH  7. Patellofemoral arthralgia of both knees Noted of both knees. Discussed strengthening exercises. May try diclofenac gel (salonpas or voltaren) OTC. May use tylenol OTC.    No follow-ups on file.      Reynolds Bowl, PA-C, have reviewed all documentation for this visit. The documentation on 04/23/20 for the exam, diagnosis, procedures, and orders are all accurate and complete.   Rubye Beach  [facility] [phone] (phone) [phone] (fax)  [place]

## 2020-04-22 ENCOUNTER — Other Ambulatory Visit: Payer: Self-pay

## 2020-04-22 ENCOUNTER — Encounter: Payer: Self-pay | Admitting: Physician Assistant

## 2020-04-22 ENCOUNTER — Ambulatory Visit (INDEPENDENT_AMBULATORY_CARE_PROVIDER_SITE_OTHER): Payer: 59 | Admitting: Physician Assistant

## 2020-04-22 VITALS — BP 122/84 | HR 93 | Temp 96.9°F | Wt 135.4 lb

## 2020-04-22 DIAGNOSIS — M222X1 Patellofemoral disorders, right knee: Secondary | ICD-10-CM

## 2020-04-22 DIAGNOSIS — I10 Essential (primary) hypertension: Secondary | ICD-10-CM

## 2020-04-22 DIAGNOSIS — M222X2 Patellofemoral disorders, left knee: Secondary | ICD-10-CM

## 2020-04-22 DIAGNOSIS — M7989 Other specified soft tissue disorders: Secondary | ICD-10-CM | POA: Diagnosis not present

## 2020-04-22 DIAGNOSIS — R5382 Chronic fatigue, unspecified: Secondary | ICD-10-CM

## 2020-04-22 DIAGNOSIS — L659 Nonscarring hair loss, unspecified: Secondary | ICD-10-CM

## 2020-04-22 DIAGNOSIS — R635 Abnormal weight gain: Secondary | ICD-10-CM

## 2020-04-22 MED ORDER — LOSARTAN POTASSIUM-HCTZ 100-25 MG PO TABS: 1.0000 | ORAL_TABLET | Freq: Every day | ORAL | 3 refills | Status: AC

## 2020-04-22 NOTE — Patient Instructions (Signed)
Diclofenac gel (Voltaren or Salonpas) up to 4 times daily for knee pain  Patellofemoral Pain Syndrome  Patellofemoral pain syndrome is a condition in which the tissue (cartilage) on the underside of the kneecap (patella) softens or breaks down. This causes pain in the front of the knee. The condition is also called runner's knee or chondromalacia patella. Patellofemoral pain syndrome is most common in young adults who are active in sports. The knee is the largest joint in the body. The patella covers the front of the knee and is attached to muscles above and below the knee. The underside of the patella is covered with a smooth type of cartilage (synovium). The smooth surface helps the patella to glide easily when you move your knee. Patellofemoral pain syndrome causes swelling in the joint linings and bone surfaces in the knee. What are the causes? This condition may be caused by:  Overuse of the knee.  Poor alignment of your knee joints.  Weak leg muscles.  A direct blow to your kneecap. What increases the risk? You are more likely to develop this condition if:  You do a lot of activities that can wear down your kneecap. These include: ? Running. ? Squatting. ? Climbing stairs.  You start a new physical activity or exercise program.  You wear shoes that do not fit well.  You do not have good leg strength.  You are overweight. What are the signs or symptoms? The main symptom of this condition is knee pain. This may feel like a dull, aching pain underneath your patella, in the front of your knee. There may be a popping or cracking sound when you move your knee. Pain may get worse with:  Exercise.  Climbing stairs.  Running.  Jumping.  Squatting.  Kneeling.  Sitting for a long time.  Moving or pushing on your patella. How is this diagnosed? This condition may be diagnosed based on:  Your symptoms and medical history. You may be asked about your recent physical  activities and which ones cause knee pain.  A physical exam. This may include: ? Moving your patella back and forth. ? Checking your range of knee motion. ? Having you squat or jump to see if you have pain. ? Checking the strength of your leg muscles.  Imaging tests to confirm the diagnosis. These may include an MRI of your knee. How is this treated? This condition may be treated at home with rest, ice, compression, and elevation (RICE).  Other treatments may include:  Nonsteroidal anti-inflammatory drugs (NSAIDs).  Physical therapy to stretch and strengthen your leg muscles.  Shoe inserts (orthotics) to take stress off your knee.  A knee brace or knee support.  Adhesive tapes to the skin.  Surgery to remove damaged cartilage or move the patella to a better position. This is rare. Follow these instructions at home: If you have a shoe or brace:  Wear the shoe or brace as told by your health care provider. Remove it only as told by your health care provider.  Loosen the shoe or brace if your toes tingle, become numb, or turn cold and blue.  Keep the shoe or brace clean.  If the shoe or brace is not waterproof: ? Do not let it get wet. ? Cover it with a watertight covering when you take a bath or a shower. Managing pain, stiffness, and swelling  If directed, put ice on the painful area. ? If you have a removable shoe or brace, remove it  as told by your health care provider. ? Put ice in a plastic bag. ? Place a towel between your skin and the bag. ? Leave the ice on for 20 minutes, 2-3 times a day.  Move your toes often to avoid stiffness and to lessen swelling.  Rest your knee: ? Avoid activities that cause knee pain. ? When sitting or lying down, raise (elevate) the injured area above the level of your heart, whenever possible. General instructions  Take over-the-counter and prescription medicines only as told by your health care provider.  Use splints, braces,  knee supports, or walking aids as directed by your health care provider.  Perform stretching and strengthening exercises as told by your health care provider or physical therapist.  Do not use any products that contain nicotine or tobacco, such as cigarettes and e-cigarettes. These can delay healing. If you need help quitting, ask your health care provider.  Return to your normal activities as told by your health care provider. Ask your health care provider what activities are safe for you.  Keep all follow-up visits as told by your health care provider. This is important. Contact a health care provider if:  Your symptoms get worse.  You are not improving with home care. Summary  Patellofemoral pain syndrome is a condition in which the tissue (cartilage) on the underside of the kneecap (patella) softens or breaks down.  This condition causes swelling in the joint linings and bone surfaces in the knee. This leads to pain in the front of the knee.  This condition may be treated at home with rest, ice, compression, and elevation (RICE).  Use splints, braces, knee supports, or walking aids as directed by your health care provider. This information is not intended to replace advice given to you by your health care provider. Make sure you discuss any questions you have with your health care provider. Document Revised: 12/06/2017 Document Reviewed: 12/06/2017 Elsevier Patient Education  2020 [place] for [employer]ers, 15(4), [phone]. Retrieved August 15, 2018 from http://clinicalkey.com/nursing">  Knee Exercises Ask your health care provider which exercises are safe for you. Do exercises exactly as told by your health care provider and adjust them as directed. It is normal to feel mild stretching, pulling, tightness, or discomfort as you do these exercises. Stop right away if you feel sudden pain or your pain gets worse. Do not begin these exercises until told by your  health care provider. Stretching and range-of-motion exercises These exercises warm up your muscles and joints and improve the movement and flexibility of your knee. These exercises also help to relieve pain and swelling. Knee extension, prone 1. Lie on your abdomen (prone position) on a bed. 2. Place your left / right knee just beyond the edge of the surface so your knee is not on the bed. You can put a towel under your left / right thigh just above your kneecap for comfort. 3. Relax your leg muscles and allow gravity to straighten your knee (extension). You should feel a stretch behind your left / right knee. 4. Hold this position for __________ seconds. 5. Scoot up so your knee is supported between repetitions. Repeat __________ times. Complete this exercise __________ times a day. Knee flexion, active  1. Lie on your back with both legs straight. If this causes back discomfort, bend your left / right knee so your foot is flat on the floor. 2. Slowly slide your left / right heel back toward your buttocks. Stop  when you feel a gentle stretch in the front of your knee or thigh (flexion). 3. Hold this position for __________ seconds. 4. Slowly slide your left / right heel back to the starting position. Repeat __________ times. Complete this exercise __________ times a day. Quadriceps stretch, prone  1. Lie on your abdomen on a firm surface, such as a bed or padded floor. 2. Bend your left / right knee and hold your ankle. If you cannot reach your ankle or pant leg, loop a belt around your foot and grab the belt instead. 3. Gently pull your heel toward your buttocks. Your knee should not slide out to the side. You should feel a stretch in the front of your thigh and knee (quadriceps). 4. Hold this position for __________ seconds. Repeat __________ times. Complete this exercise __________ times a day. Hamstring, supine 1. Lie on your back (supine position). 2. Loop a belt or towel over the  ball of your left / right foot. The ball of your foot is on the walking surface, right under your toes. 3. Straighten your left / right knee and slowly pull on the belt to raise your leg until you feel a gentle stretch behind your knee (hamstring). ? Do not let your knee bend while you do this. ? Keep your other leg flat on the floor. 4. Hold this position for __________ seconds. Repeat __________ times. Complete this exercise __________ times a day. Strengthening exercises These exercises build strength and endurance in your knee. Endurance is the ability to use your muscles for a long time, even after they get tired. Quadriceps, isometric This exercise stretches the muscles in front of your thigh (quadriceps) without moving your knee joint (isometric). 1. Lie on your back with your left / right leg extended and your other knee bent. Put a rolled towel or small pillow under your knee if told by your health care provider. 2. Slowly tense the muscles in the front of your left / right thigh. You should see your kneecap slide up toward your hip or see increased dimpling just above the knee. This motion will push the back of the knee toward the floor. 3. For __________ seconds, hold the muscle as tight as you can without increasing your pain. 4. Relax the muscles slowly and completely. Repeat __________ times. Complete this exercise __________ times a day. Straight leg raises This exercise stretches the muscles in front of your thigh (quadriceps) and the muscles that move your hips (hip flexors). 1. Lie on your back with your left / right leg extended and your other knee bent. 2. Tense the muscles in the front of your left / right thigh. You should see your kneecap slide up or see increased dimpling just above the knee. Your thigh may even shake a bit. 3. Keep these muscles tight as you raise your leg 4-6 inches (10-15 cm) off the floor. Do not let your knee bend. 4. Hold this position for __________  seconds. 5. Keep these muscles tense as you lower your leg. 6. Relax your muscles slowly and completely after each repetition. Repeat __________ times. Complete this exercise __________ times a day. Hamstring, isometric 1. Lie on your back on a firm surface. 2. Bend your left / right knee about __________ degrees. 3. Dig your left / right heel into the surface as if you are trying to pull it toward your buttocks. Tighten the muscles in the back of your thighs (hamstring) to "dig" as hard as you can  without increasing any pain. 4. Hold this position for __________ seconds. 5. Release the tension gradually and allow your muscles to relax completely for __________ seconds after each repetition. Repeat __________ times. Complete this exercise __________ times a day. Hamstring curls If told by your health care provider, do this exercise while wearing ankle weights. Begin with __________ lb weights. Then increase the weight by 1 lb (0.5 kg) increments. Do not wear ankle weights that are more than __________ lb. 1. Lie on your abdomen with your legs straight. 2. Bend your left / right knee as far as you can without feeling pain. Keep your hips flat against the floor. 3. Hold this position for __________ seconds. 4. Slowly lower your leg to the starting position. Repeat __________ times. Complete this exercise __________ times a day. Squats This exercise strengthens the muscles in front of your thigh and knee (quadriceps). 1. Stand in front of a table, with your feet and knees pointing straight ahead. You may rest your hands on the table for balance but not for support. 2. Slowly bend your knees and lower your hips like you are going to sit in a chair. ? Keep your weight over your heels, not over your toes. ? Keep your lower legs upright so they are parallel with the table legs. ? Do not let your hips go lower than your knees. ? Do not bend lower than told by your health care provider. ? If your  knee pain increases, do not bend as low. 3. Hold the squat position for __________ seconds. 4. Slowly push with your legs to return to standing. Do not use your hands to pull yourself to standing. Repeat __________ times. Complete this exercise __________ times a day. Wall slides This exercise strengthens the muscles in front of your thigh and knee (quadriceps). 1. Lean your back against a smooth wall or door, and walk your feet out 18-24 inches (46-61 cm) from it. 2. Place your feet hip-width apart. 3. Slowly slide down the wall or door until your knees bend __________ degrees. Keep your knees over your heels, not over your toes. Keep your knees in line with your hips. 4. Hold this position for __________ seconds. Repeat __________ times. Complete this exercise __________ times a day. Straight leg raises This exercise strengthens the muscles that rotate the leg at the hip and move it away from your body (hip abductors). 1. Lie on your side with your left / right leg in the top position. Lie so your head, shoulder, knee, and hip line up. You may bend your bottom knee to help you keep your balance. 2. Roll your hips slightly forward so your hips are stacked directly over each other and your left / right knee is facing forward. 3. Leading with your heel, lift your top leg 4-6 inches (10-15 cm). You should feel the muscles in your outer hip lifting. ? Do not let your foot drift forward. ? Do not let your knee roll toward the ceiling. 4. Hold this position for __________ seconds. 5. Slowly return your leg to the starting position. 6. Let your muscles relax completely after each repetition. Repeat __________ times. Complete this exercise __________ times a day. Straight leg raises This exercise stretches the muscles that move your hips away from the front of the pelvis (hip extensors). 1. Lie on your abdomen on a firm surface. You can put a pillow under your hips if that is more  comfortable. 2. Tense the muscles in your buttocks  and lift your left / right leg about 4-6 inches (10-15 cm). Keep your knee straight as you lift your leg. 3. Hold this position for __________ seconds. 4. Slowly lower your leg to the starting position. 5. Let your leg relax completely after each repetition. Repeat __________ times. Complete this exercise __________ times a day. This information is not intended to replace advice given to you by your health care provider. Make sure you discuss any questions you have with your health care provider. Document Revised: 08/16/2018 Document Reviewed: 08/16/2018 Elsevier Patient Education  2020 [employer].

## 2020-04-23 LAB — T4 AND TSH
T4, Total: 6.2 ug/dL (ref 4.5–12.0)
TSH: 1.47 u[IU]/mL (ref 0.450–4.500)

## 2020-04-24 ENCOUNTER — Telehealth: Payer: Self-pay

## 2020-04-24 NOTE — Telephone Encounter (Signed)
Result Care Coordination   Result Notes and Patient Communication   Append Comments  Seen Back to Top   Thyroid is normal.  Written by Mar Daring, PA-C on 04/23/2020 5:35 PM EDT Seen by patient Margaret Waller on 04/23/2020 5:50 PM

## 2020-04-24 NOTE — Telephone Encounter (Signed)
-----   Message from Mar Daring, [place] sent at 04/23/2020  5:35 PM EDT ----- Thyroid is normal.

## 2020-05-01 ENCOUNTER — Ambulatory Visit: Payer: 59 | Admitting: Physician Assistant

## 2020-05-06 ENCOUNTER — Ambulatory Visit: Payer: Self-pay | Admitting: Physician Assistant

## 2020-05-06 NOTE — Progress Notes (Deleted)
Established patient visit   Patient: Margaret Waller   DOB: 1976/04/29   44 y.o. Female  MRN: [id number] Visit Date: 05/06/2020  Today's healthcare provider: Trinna Post, PA-C   No chief complaint on file.  Subjective    HPI  Hypertension, follow-up  BP Readings from Last 3 Encounters:  04/22/20 122/84  03/22/20 132/76  02/22/20 120/78   Wt Readings from Last 3 Encounters:  04/22/20 135 lb 6.4 oz (61.4 kg)  03/22/20 134 lb (60.8 kg)  01/12/20 129 lb 9.6 oz (58.8 kg)     She was last seen for hypertension 2 weeks ago.  BP at that visit was 122/84. Management since that visit includes stopping amlodipine to see if it helps decrease leg swelling. Also, Losartan/HCTZ was increased to 100/25mg  daily.   She reports {excellent/good/fair/poor:19665} compliance with treatment. She {is/is not:9024} having side effects. {document side effects if present:1} She is following a {diet:21022986} diet. She {is/is not:9024} exercising. She {does/does not:200015} smoke.  Use of agents associated with hypertension: {bp agents assoc with hypertension:511::"none"}.   Outside blood pressures are {***enter patient reported home BP readings, or 'not being checked':1}. Symptoms: {Yes/No:20286} chest pain {Yes/No:20286} chest pressure  {Yes/No:20286} palpitations {Yes/No:20286} syncope  {Yes/No:20286} dyspnea {Yes/No:20286} orthopnea  {Yes/No:20286} paroxysmal nocturnal dyspnea {Yes/No:20286} lower extremity edema   Pertinent labs: Lab Results  Component Value Date   CHOL 167 05/16/2019   HDL 58 05/16/2019   LDLCALC 95 05/16/2019   TRIG 68 05/16/2019   CHOLHDL 2.9 05/16/2019   Lab Results  Component Value Date   NA 139 03/22/2020   K 4.0 03/22/2020   CREATININE 1.13 (H) 03/22/2020   GFRNONAA 59 (L) 03/22/2020   GFRAA 68 03/22/2020   GLUCOSE 87 03/22/2020     The 10-year ASCVD risk score Mikey Bussing DC Jr., et al., 2013) is: 0.7%      {Show patient history  (optional):23778::" "}   Medications: Outpatient Medications Prior to Visit  Medication Sig  . acetaminophen (TYLENOL) 500 MG tablet Take by mouth.  Marland Kitchen buPROPion (WELLBUTRIN SR) 150 MG 12 hr tablet TAKE ONE TABLET BY MOUTH DAILY FOR THREE DAYS. THEN TAKE ONE TABLET BY MOUTH TWICE DAILY THEREAFTER  . busPIRone (BUSPAR) 7.5 MG tablet Take by mouth.  . cetirizine (ZYRTEC) 10 MG tablet Take 1 tablet (10 mg total) by mouth daily.  . Cholecalciferol (VITAMIN D-1000 MAX ST) 25 MCG (1000 UT) tablet Take by mouth.  . cyclobenzaprine (FLEXERIL) 10 MG tablet Take by mouth.  Eduard Roux (AIMOVIG) 140 MG/ML SOAJ Inject into the skin.  Marland Kitchen linaclotide (LINZESS) 145 MCG CAPS capsule Take 1 capsule (145 mcg total) by mouth daily before breakfast.  . losartan-hydrochlorothiazide (HYZAAR) 100-25 MG tablet Take 1 tablet by mouth daily.  . medroxyPROGESTERone Acetate 150 MG/ML SUSY Inject into the muscle.  . nortriptyline (PAMELOR) 50 MG capsule Take by mouth.  Marland Kitchen omeprazole (PRILOSEC) 20 MG capsule Take 1 capsule (20 mg total) by mouth daily.  Marland Kitchen topiramate (TOPAMAX) 100 MG tablet Take by mouth.  . Topiramate ER (TROKENDI XR) 200 MG CP24 Take by mouth.  . Vitamin D, Ergocalciferol, (DRISDOL) 1.25 MG (50000 UT) CAPS capsule Take 1 capsule (50,000 Units total) by mouth every 7 (seven) days.   No facility-administered medications prior to visit.    Review of Systems  {Heme  Chem  Endocrine  Serology  Results Review (optional):23779::" "}  Objective    There were no vitals taken for this visit. {Show previous vital signs (  optional):23777::" "}  Physical Exam  ***  No results found for any visits on 05/06/20.  Assessment & Plan     ***  No follow-ups on file.      {provider attestation***:1}   Paulene Floor  [facility] [phone] (phone) [phone] (fax)  [place]

## 2020-06-09 ENCOUNTER — Other Ambulatory Visit: Payer: Self-pay | Admitting: Physician Assistant

## 2020-06-09 DIAGNOSIS — F32A Depression, unspecified: Secondary | ICD-10-CM

## 2020-06-09 NOTE — Telephone Encounter (Signed)
Requested Prescriptions  Pending Prescriptions Disp Refills  . buPROPion (WELLBUTRIN SR) 150 MG 12 hr tablet [Pharmacy Med Name: buPROPion HCl ER (SR) 150 MG Oral Tablet Extended Release 12 Hour] 180 tablet 0    Sig: TAKE ONE TABLET BY MOUTH DAILY FOR THREE DAYS. THEN TAKE ONE TABLET BY MOUTH TWICE DAILY THEREAFTER     Psychiatry: Antidepressants - bupropion Passed - 06/09/2020  9:41 AM      Passed - Last BP in normal range    BP Readings from Last 1 Encounters:  04/22/20 122/84         Passed - Valid encounter within last 6 months    Recent Outpatient Visits          1 month ago Essential hypertension   [place], Clearnce Sorrel, [place]   2 months ago Essential hypertension   [place], [place], [place]   3 months ago Essential hypertension   [place], [place], [place]   4 months ago Essential hypertension   [place], [place], [place]   6 months ago Other migraine without status migrainosus, not intractable   [place], Wendee Beavers, [place]      Future Appointments            In 3 months Trinna Post, PA-C [employer], [facility]

## 2020-06-27 ENCOUNTER — Other Ambulatory Visit: Payer: Self-pay | Admitting: Obstetrics and Gynecology

## 2020-06-27 DIAGNOSIS — Z1231 Encounter for screening mammogram for malignant neoplasm of breast: Secondary | ICD-10-CM

## 2020-07-25 ENCOUNTER — Ambulatory Visit
Admission: RE | Admit: 2020-07-25 | Discharge: 2020-07-25 | Disposition: A | Discharge status: Home / Self Care | Payer: 59 | Source: Ambulatory Visit | Attending: Obstetrics and Gynecology | Admitting: Obstetrics and Gynecology

## 2020-07-25 ENCOUNTER — Other Ambulatory Visit: Payer: Self-pay

## 2020-07-25 DIAGNOSIS — Z1231 Encounter for screening mammogram for malignant neoplasm of breast: Secondary | ICD-10-CM | POA: Diagnosis present

## 2020-09-06 ENCOUNTER — Other Ambulatory Visit: Payer: Self-pay | Admitting: Physician Assistant

## 2020-09-06 DIAGNOSIS — J301 Allergic rhinitis due to pollen: Secondary | ICD-10-CM

## 2020-09-06 DIAGNOSIS — K219 Gastro-esophageal reflux disease without esophagitis: Secondary | ICD-10-CM

## 2020-09-24 ENCOUNTER — Ambulatory Visit: Payer: Self-pay | Admitting: Physician Assistant

## 2020-09-24 ENCOUNTER — Telehealth: Payer: Self-pay

## 2020-09-24 NOTE — Telephone Encounter (Signed)
Copied from [place] [id number]. Topic: Quick Communication - Appointment Cancellation >> Sep 24, 2020  8:32 AM Lennox Solders wrote: Patient called to cancel appointment scheduled for Adriana . Patient  HAS NOT rescheduled their appointment. Pt is going out of town today due to mother in law may not make it  Route to department's [facility] pool.

## 2020-12-27 IMAGING — MG DIGITAL SCREENING BILAT W/ TOMO W/ CAD
6 of 10 series · 6 of 30 positions shown · non-contrast
Comparison: Previous exam(s).

CLINICAL DATA: Screening.

EXAM:
DIGITAL SCREENING BILATERAL MAMMOGRAM WITH TOMO AND CAD

[L MLO synth-2D]
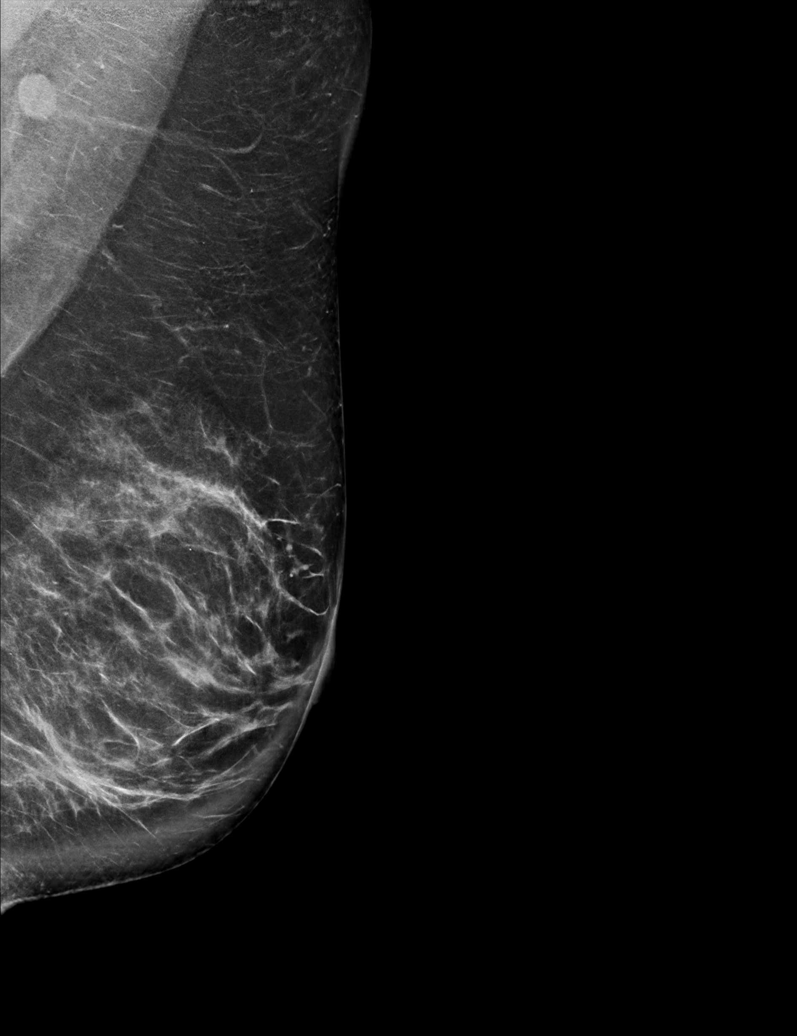

[R MLO synth-2D (1 of 2)]
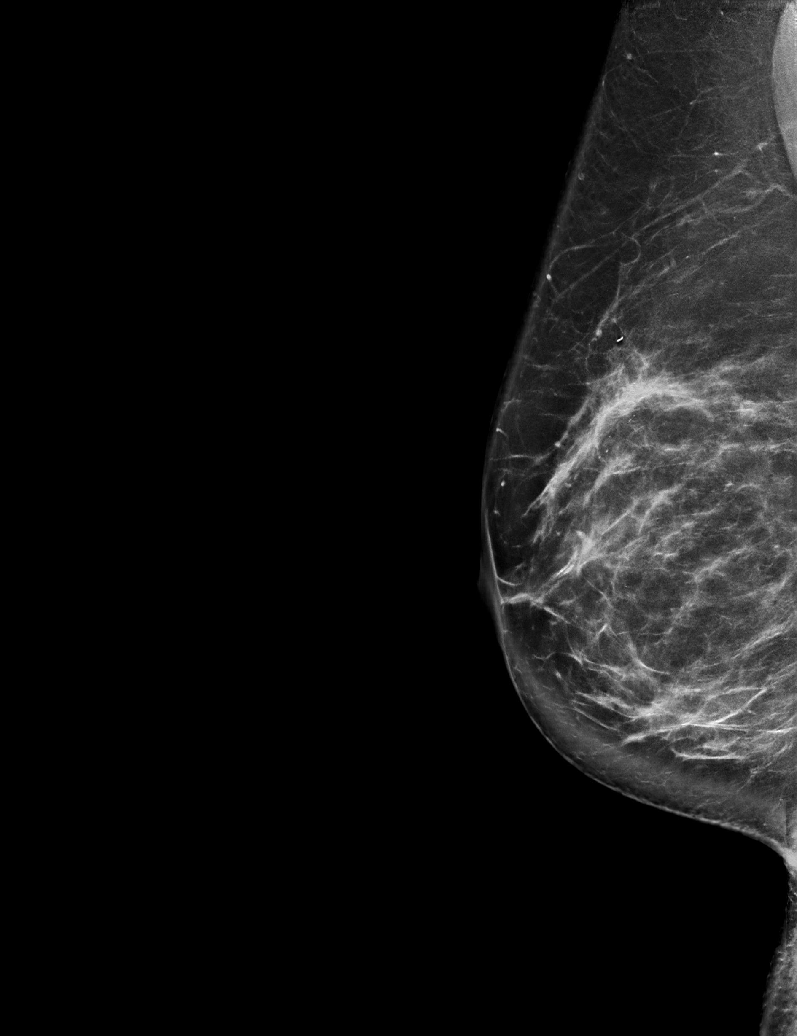

[L CC synth-2D]
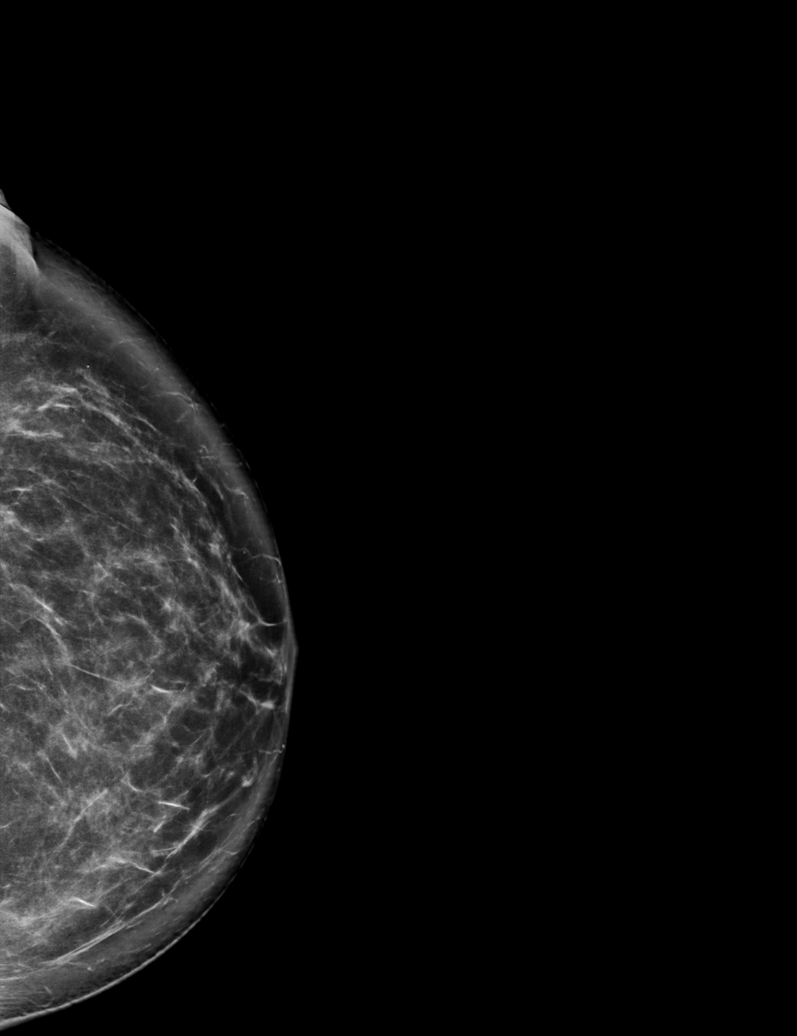

[R CC synth-2D]
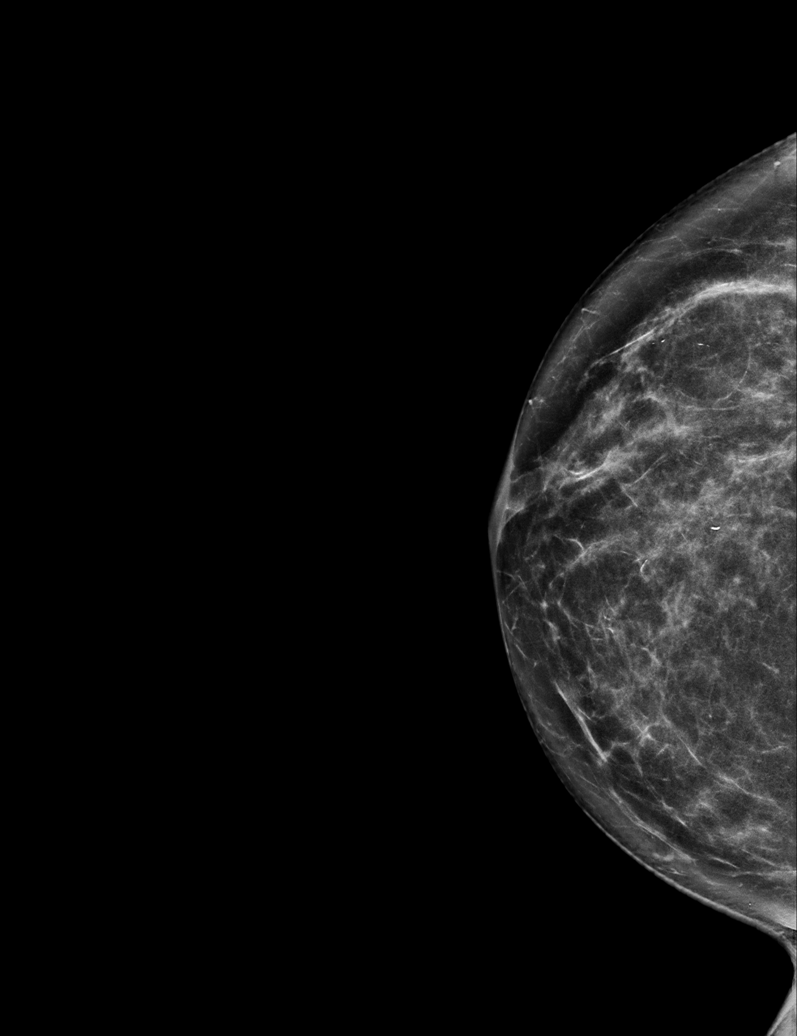

[R MLO synth-2D (2 of 2)]
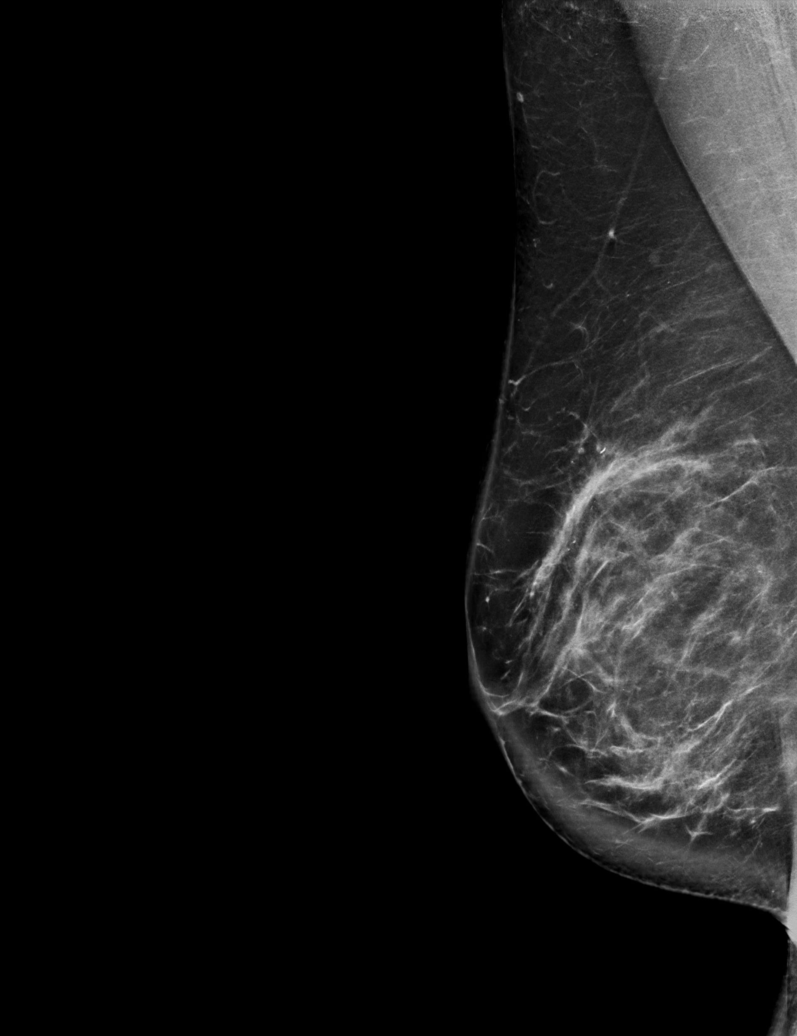

[L MLO tomo · tomo slice 47/94.0]
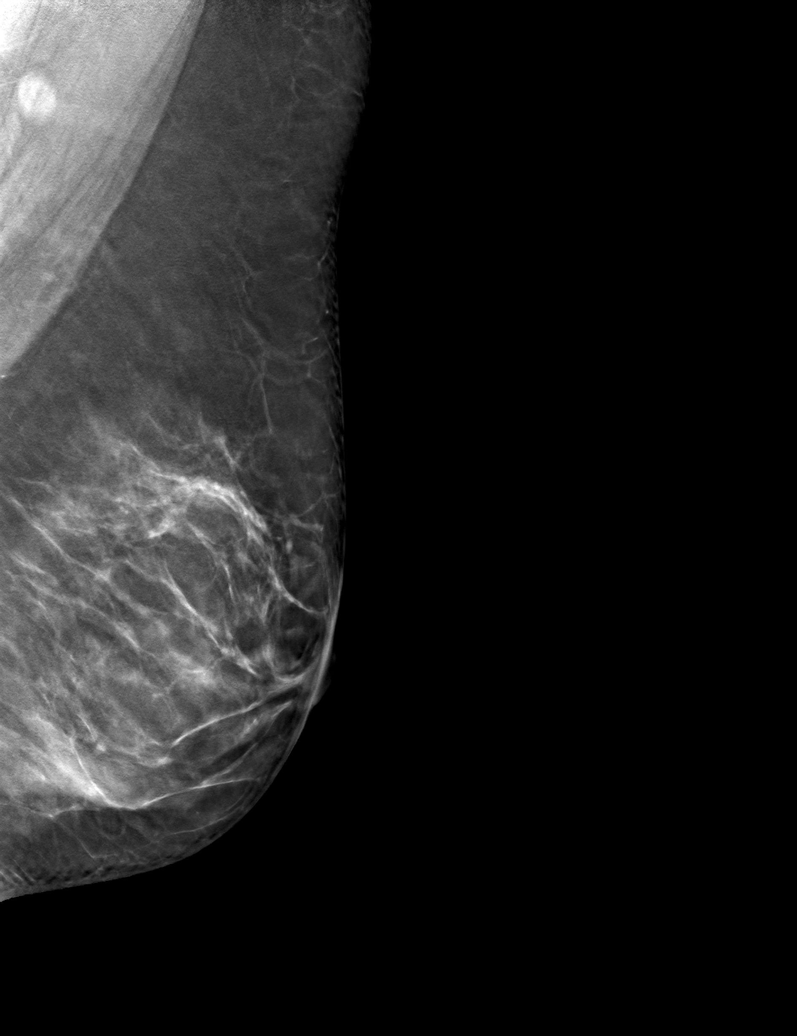

[6 of 30 positions shown; findings below may reference images not displayed]

ACR Breast Density Category c: The breast tissue is heterogeneously
dense, which may obscure small masses.
FINDINGS: There are no findings suspicious for malignancy. Images were
processed with CAD.
IMPRESSION: No mammographic evidence of malignancy. A result letter of this
screening mammogram will be mailed directly to the patient.

RECOMMENDATION:
Screening mammogram in one year. (Code:FT-[id number])

BI-RADS CATEGORY  1: Negative.

## 2021-07-01 ENCOUNTER — Other Ambulatory Visit: Payer: Self-pay | Admitting: Obstetrics and Gynecology

## 2021-07-01 DIAGNOSIS — Z1231 Encounter for screening mammogram for malignant neoplasm of breast: Secondary | ICD-10-CM

## 2021-07-28 ENCOUNTER — Other Ambulatory Visit: Payer: Self-pay

## 2021-07-28 ENCOUNTER — Ambulatory Visit
Admission: RE | Admit: 2021-07-28 | Discharge: 2021-07-28 | Disposition: A | Discharge status: Home / Self Care | Payer: 59 | Source: Ambulatory Visit | Attending: Obstetrics and Gynecology | Admitting: Obstetrics and Gynecology

## 2021-07-28 DIAGNOSIS — Z1231 Encounter for screening mammogram for malignant neoplasm of breast: Secondary | ICD-10-CM | POA: Diagnosis present

## 2021-08-11 ENCOUNTER — Other Ambulatory Visit: Payer: Self-pay | Admitting: Obstetrics and Gynecology

## 2021-08-11 DIAGNOSIS — N6489 Other specified disorders of breast: Secondary | ICD-10-CM

## 2021-08-11 DIAGNOSIS — R928 Other abnormal and inconclusive findings on diagnostic imaging of breast: Secondary | ICD-10-CM

## 2021-08-14 ENCOUNTER — Other Ambulatory Visit: Payer: Self-pay

## 2021-08-14 ENCOUNTER — Ambulatory Visit
Admission: RE | Admit: 2021-08-14 | Discharge: 2021-08-14 | Disposition: A | Discharge status: Home / Self Care | Payer: 59 | Source: Ambulatory Visit | Attending: Obstetrics and Gynecology | Admitting: Obstetrics and Gynecology

## 2021-08-14 DIAGNOSIS — N6489 Other specified disorders of breast: Secondary | ICD-10-CM | POA: Diagnosis present

## 2021-08-14 DIAGNOSIS — R928 Other abnormal and inconclusive findings on diagnostic imaging of breast: Secondary | ICD-10-CM

## 2021-08-15 ENCOUNTER — Other Ambulatory Visit: Payer: Self-pay | Admitting: Obstetrics and Gynecology

## 2021-08-15 DIAGNOSIS — N6489 Other specified disorders of breast: Secondary | ICD-10-CM

## 2021-09-23 ENCOUNTER — Other Ambulatory Visit: Payer: Self-pay | Admitting: Neurology

## 2021-09-23 DIAGNOSIS — R200 Anesthesia of skin: Secondary | ICD-10-CM

## 2021-09-28 ENCOUNTER — Other Ambulatory Visit: Payer: Self-pay

## 2021-09-28 ENCOUNTER — Ambulatory Visit
Admission: RE | Admit: 2021-09-28 | Discharge: 2021-09-28 | Disposition: A | Discharge status: Home / Self Care | Payer: 59 | Source: Ambulatory Visit | Attending: Neurology | Admitting: Neurology

## 2021-09-28 DIAGNOSIS — R200 Anesthesia of skin: Secondary | ICD-10-CM | POA: Diagnosis not present

## 2022-01-19 ENCOUNTER — Other Ambulatory Visit: Payer: 59

## 2022-01-22 ENCOUNTER — Ambulatory Visit
Admission: RE | Admit: 2022-01-22 | Discharge: 2022-01-22 | Disposition: A | Discharge status: Home / Self Care | Payer: 59 | Source: Ambulatory Visit | Attending: Obstetrics and Gynecology | Admitting: Obstetrics and Gynecology

## 2022-01-22 DIAGNOSIS — N6489 Other specified disorders of breast: Secondary | ICD-10-CM | POA: Diagnosis present

## 2022-05-29 ENCOUNTER — Emergency Department: Payer: 59

## 2022-05-29 ENCOUNTER — Emergency Department
Admission: EM | Admit: 2022-05-29 | Discharge: 2022-05-29 | Disposition: A | Discharge status: Home / Self Care | Payer: 59 | Attending: Emergency Medicine | Admitting: Emergency Medicine

## 2022-05-29 ENCOUNTER — Other Ambulatory Visit: Payer: Self-pay

## 2022-05-29 DIAGNOSIS — R1031 Right lower quadrant pain: Secondary | ICD-10-CM | POA: Diagnosis not present

## 2022-05-29 DIAGNOSIS — Z85828 Personal history of other malignant neoplasm of skin: Secondary | ICD-10-CM | POA: Diagnosis not present

## 2022-05-29 DIAGNOSIS — R110 Nausea: Secondary | ICD-10-CM | POA: Insufficient documentation

## 2022-05-29 LAB — CBC
HCT: 43.5 % (ref 36.0–46.0)
Hemoglobin: 13.9 g/dL (ref 12.0–15.0)
MCH: 28.6 pg (ref 26.0–34.0)
MCHC: 32 g/dL (ref 30.0–36.0)
MCV: 89.5 fL (ref 80.0–100.0)
Platelets: 306 10*3/uL (ref 150–400)
RBC: 4.86 MIL/uL (ref 3.87–5.11)
RDW: 12.9 % (ref 11.5–15.5)
WBC: 5.8 10*3/uL (ref 4.0–10.5)
nRBC: 0 % (ref 0.0–0.2)

## 2022-05-29 LAB — BASIC METABOLIC PANEL
Anion gap: 5 (ref 5–15)
BUN: 12 mg/dL (ref 6–20)
CO2: 24 mmol/L (ref 22–32)
Calcium: 9.2 mg/dL (ref 8.9–10.3)
Chloride: 111 mmol/L (ref 98–111)
Creatinine, Ser: 1.09 mg/dL — ABNORMAL HIGH (ref 0.44–1.00)
GFR, Estimated: >60 mL/min (ref >60)
Glucose, Bld: 97 mg/dL (ref 70–99)
Potassium: 3.9 mmol/L (ref 3.5–5.1)
Sodium: 140 mmol/L (ref 135–145)

## 2022-05-29 LAB — URINALYSIS, ROUTINE W REFLEX MICROSCOPIC
Bilirubin Urine: NEGATIVE
Glucose, UA: NEGATIVE mg/dL
Ketones, ur: NEGATIVE mg/dL
Leukocytes,Ua: NEGATIVE
Nitrite: NEGATIVE
Protein, ur: NEGATIVE mg/dL
Specific Gravity, Urine: 1.015 (ref 1.005–1.030)
pH: 5 (ref 5.0–8.0)

## 2022-05-29 LAB — POC URINE PREG, ED: Preg Test, Ur: NEGATIVE

## 2022-05-29 MED ORDER — KETOROLAC TROMETHAMINE 15 MG/ML IJ SOLN
15.0000 mg | Freq: Once | INTRAMUSCULAR | Status: AC
  Administered 2022-05-29: 15 mg via INTRAVENOUS
  Filled 2022-05-29: qty 1

## 2022-05-29 MED ORDER — LACTATED RINGERS IV BOLUS
1000.0000 mL | Freq: Once | INTRAVENOUS | Status: AC
  Administered 2022-05-29: 1000 mL via INTRAVENOUS

## 2022-05-29 MED ORDER — ONDANSETRON HCL 4 MG/2ML IJ SOLN
4.0000 mg | Freq: Once | INTRAMUSCULAR | Status: AC
  Administered 2022-05-29: 4 mg via INTRAVENOUS
  Filled 2022-05-29: qty 2

## 2022-05-29 MED ORDER — ONDANSETRON HCL 4 MG PO TABS: 4.0000 mg | ORAL_TABLET | Freq: Three times a day (TID) | ORAL | 0 refills | Status: AC | PRN

## 2022-05-29 MED ORDER — MORPHINE SULFATE (PF) 4 MG/ML IV SOLN: 4.0000 mg | Freq: Once | INTRAVENOUS | Status: DC

## 2022-05-29 NOTE — Discharge Instructions (Signed)
As we discussed it is possible you have a very small stone that I cannot see on CT or just recently passed a stone.  Please discuss with your primary care doctor whether you should continue the antibiotics prescribed as I do not see clear indication for this at this time but I will check with them.  Please return for any new or worsening of symptoms.  As we discussed I think it is reasonable to take 400 mg a Profen every 8 hours over the next 24 to 48 hours for severe pain as your kidney function is good today.  Please drink plenty of fluids and use Zofran as needed.

## 2022-05-29 NOTE — ED Provider Notes (Signed)
[facility] Provider Note    Event Date/Time   First MD Initiated Contact with Patient 05/29/22 1201     (approximate)   History   Abdominal Pain   HPI  Margaret Waller is a 46 y.o. female with a past medical history of anxiety, depression and skin cancer who presents for evaluation of 3 days of some colicky right lower abdominal pain rating to the right groin.  She reports he was seen by PCP yesterday and had a urinalysis obtained that was fairly dark and started on ciprofloxacin for possible UTI.  She denies any burning with urination last couple days although 5 days ago she had an episode of burning with urination after she had an energy drink.  She does not have any current back pain, abdomen or left-sided abdominal pain.  No fevers, chills, cough, vomiting, diarrhea or any other sick symptoms aside from some nausea.  No EtOH use or illicit drug use.  No injuries.  She is never had a stone before.  She is status post cholecystectomy but still has her appendix.    Past Medical History:  Diagnosis Date   Anxiety    Cancer [facility]    skin   Depression      Physical Exam  Triage Vital Signs: ED Triage Vitals  Enc Vitals Group     BP 05/29/22 1125 (!) 171/100     Pulse Rate 05/29/22 1125 84     Resp 05/29/22 1125 17     Temp 05/29/22 1125 98.5 F (36.9 C)     Temp Source 05/29/22 1125 Oral     SpO2 05/29/22 1125 98 %     Weight --      Height --      Head Circumference --      Peak Flow --      Pain Score 05/29/22 1126 6     Pain Loc --      Pain Edu? --      Excl. in [place]? --     Most recent vital signs: Vitals:   05/29/22 1125 05/29/22 1234  BP: (!) 171/100 120/77  Pulse: 84 83  Resp: 17 18  Temp: 98.5 F (36.9 C)   SpO2: 98% 95%    General: Awake, appears mildly uncomfortable. CV:  Good peripheral perfusion.  2+ radial pulse. Resp:  Normal effort.  Clear bilaterally. Abd:  No distention.  Soft throughout.  No significant CVA  tenderness. Other:     ED Results / Procedures / Treatments  Labs (all labs ordered are listed, but only abnormal results are displayed) Labs Reviewed  URINALYSIS, ROUTINE W REFLEX MICROSCOPIC - Abnormal; Notable for the following components:      Result Value   Color, Urine YELLOW (*)    APPearance HAZY (*)    Hgb urine dipstick MODERATE (*)    Bacteria, UA FEW (*)    All other components within normal limits  BASIC METABOLIC PANEL - Abnormal; Notable for the following components:   Creatinine, Ser 1.09 (*)    All other components within normal limits  URINE CULTURE  CBC  POC URINE PREG, ED     EKG    RADIOLOGY  CT abdomen pelvis on my interpretation without evidence of appendicitis, ureteral stone, perinephric stranding, hydronephrosis, diverticulitis, abscess or enlarged adnexa.  I reviewed and discussed with reading radiologist and agree with findings of some moderate stool burden throughout the colon but no other acute process.  PROCEDURES:  Critical Care performed: No  Procedures   MEDICATIONS ORDERED IN ED: Medications  lactated ringers bolus 1,000 mL (has no administration in time range)  ondansetron (ZOFRAN) injection 4 mg (has no administration in time range)  ketorolac (TORADOL) 15 MG/ML injection 15 mg (has no administration in time range)     IMPRESSION / MDM / ASSESSMENT AND PLAN / ED COURSE  I reviewed the triage vital signs and the nursing notes. Patient's presentation is most consistent with acute presentation with potential threat to life or bodily function.                               Differential diagnosis includes, but is not limited to cystitis, appendicitis, diverticulitis, ureteral stone.  BMP without any significant lecture light or metabolic derangements and a GFR greater than 60.  CBC without leukocytosis or acute anemia. pregnancy test is negative.  UA has some hemoglobin and few bacteria but otherwise negative.  CT abdomen pelvis  on my interpretation without evidence of appendicitis, ureteral stone, perinephric stranding, hydronephrosis, diverticulitis, abscess or enlarged adnexa.  I reviewed and discussed with reading radiologist and agree with findings of some moderate stool burden throughout the colon but no other acute process.  I suspect just recently passed stone versus possibly radiolucent stone.  There is significant stool burden although patient states she is having regular bowel movements.  This could certainly be contributing if she is a little backed up.  He is feeling much better after some Toradol and is currently pain-free on reassessment.  At this point I think she is appropriate for outpatient follow-up with her PCP.  She will take Zofran as needed to stay hydrated and use Tylenol ibuprofen over the next 24 to 48 hours.  Discussed returning for any new or worsening of symptoms.  Advised her to check with her PCP before continuing her ciprofloxacin as I do not see any convincing evidence of a severe infection or any clinically significant cystitis at this time.  We will send urine culture.  Discharged in stable condition.  Strict return precautions advised and discussed.      FINAL CLINICAL IMPRESSION(S) / ED DIAGNOSES   Final diagnoses:  RLQ abdominal pain     Rx / DC Orders   ED Discharge Orders     None        Note:  This document was prepared using Dragon voice recognition software and may include unintentional dictation errors.   Lucrezia Starch, MD 05/29/22 1321

## 2022-05-29 NOTE — ED Triage Notes (Signed)
Pt presents to ED with c/o of RLQ pain and states she was sent from PCP for eval of possible kidney stone and/or UTI. Pt denies HX of kidney stones. Pt denies fevers or chills. Pt ambulatory with steady gait to triage. NAD noted. Pt hypertensive and states HX of same.

## 2022-05-30 LAB — URINE CULTURE: Culture: NO GROWTH

## 2022-10-06 ENCOUNTER — Encounter: Payer: Self-pay | Admitting: Obstetrics and Gynecology

## 2022-10-08 ENCOUNTER — Other Ambulatory Visit: Payer: Self-pay | Admitting: Obstetrics and Gynecology

## 2022-10-08 DIAGNOSIS — N6489 Other specified disorders of breast: Secondary | ICD-10-CM

## 2022-10-15 DIAGNOSIS — Z1211 Encounter for screening for malignant neoplasm of colon: Secondary | ICD-10-CM | POA: Diagnosis not present

## 2022-10-15 DIAGNOSIS — I10 Essential (primary) hypertension: Secondary | ICD-10-CM | POA: Diagnosis not present

## 2022-10-15 DIAGNOSIS — E538 Deficiency of other specified B group vitamins: Secondary | ICD-10-CM | POA: Diagnosis not present

## 2022-10-15 DIAGNOSIS — R634 Abnormal weight loss: Secondary | ICD-10-CM | POA: Diagnosis not present

## 2022-10-15 DIAGNOSIS — M353 Polymyalgia rheumatica: Secondary | ICD-10-CM | POA: Diagnosis not present

## 2022-10-23 ENCOUNTER — Ambulatory Visit
Admission: RE | Admit: 2022-10-23 | Discharge: 2022-10-23 | Disposition: A | Discharge status: Home / Self Care | Payer: 59 | Source: Ambulatory Visit | Attending: Obstetrics and Gynecology | Admitting: Obstetrics and Gynecology

## 2022-10-23 DIAGNOSIS — N6489 Other specified disorders of breast: Secondary | ICD-10-CM

## 2022-11-13 DIAGNOSIS — D225 Melanocytic nevi of trunk: Secondary | ICD-10-CM | POA: Diagnosis not present

## 2022-11-13 DIAGNOSIS — D2262 Melanocytic nevi of left upper limb, including shoulder: Secondary | ICD-10-CM | POA: Diagnosis not present

## 2022-11-13 DIAGNOSIS — D2261 Melanocytic nevi of right upper limb, including shoulder: Secondary | ICD-10-CM | POA: Diagnosis not present

## 2022-11-13 DIAGNOSIS — Z85828 Personal history of other malignant neoplasm of skin: Secondary | ICD-10-CM | POA: Diagnosis not present

## 2022-11-13 DIAGNOSIS — L538 Other specified erythematous conditions: Secondary | ICD-10-CM | POA: Diagnosis not present

## 2022-11-13 DIAGNOSIS — B078 Other viral warts: Secondary | ICD-10-CM | POA: Diagnosis not present

## 2023-02-11 DIAGNOSIS — G4719 Other hypersomnia: Secondary | ICD-10-CM | POA: Diagnosis not present

## 2023-02-11 DIAGNOSIS — R200 Anesthesia of skin: Secondary | ICD-10-CM | POA: Diagnosis not present

## 2023-02-11 DIAGNOSIS — G43109 Migraine with aura, not intractable, without status migrainosus: Secondary | ICD-10-CM | POA: Diagnosis not present

## 2023-02-15 DIAGNOSIS — K5904 Chronic idiopathic constipation: Secondary | ICD-10-CM | POA: Diagnosis not present

## 2023-02-15 DIAGNOSIS — Z1211 Encounter for screening for malignant neoplasm of colon: Secondary | ICD-10-CM | POA: Diagnosis not present

## 2023-05-19 ENCOUNTER — Encounter: Payer: Self-pay | Admitting: Internal Medicine

## 2023-05-26 ENCOUNTER — Other Ambulatory Visit: Payer: Self-pay

## 2023-05-26 ENCOUNTER — Encounter
Admission: RE | Disposition: A | Discharge status: Home / Self Care | Payer: Self-pay | Source: Home / Self Care | Attending: Internal Medicine

## 2023-05-26 ENCOUNTER — Ambulatory Visit: Payer: 59

## 2023-05-26 ENCOUNTER — Encounter: Payer: Self-pay | Admitting: Internal Medicine

## 2023-05-26 ENCOUNTER — Ambulatory Visit
Admission: RE | Admit: 2023-05-26 | Discharge: 2023-05-26 | Disposition: A | Discharge status: Home / Self Care | Payer: 59 | Attending: Internal Medicine | Admitting: Internal Medicine

## 2023-05-26 DIAGNOSIS — Z1211 Encounter for screening for malignant neoplasm of colon: Secondary | ICD-10-CM | POA: Insufficient documentation

## 2023-05-26 DIAGNOSIS — F419 Anxiety disorder, unspecified: Secondary | ICD-10-CM | POA: Diagnosis not present

## 2023-05-26 DIAGNOSIS — K219 Gastro-esophageal reflux disease without esophagitis: Secondary | ICD-10-CM | POA: Diagnosis not present

## 2023-05-26 DIAGNOSIS — Z87891 Personal history of nicotine dependence: Secondary | ICD-10-CM | POA: Insufficient documentation

## 2023-05-26 DIAGNOSIS — N189 Chronic kidney disease, unspecified: Secondary | ICD-10-CM | POA: Insufficient documentation

## 2023-05-26 DIAGNOSIS — Z85828 Personal history of other malignant neoplasm of skin: Secondary | ICD-10-CM | POA: Insufficient documentation

## 2023-05-26 DIAGNOSIS — I129 Hypertensive chronic kidney disease with stage 1 through stage 4 chronic kidney disease, or unspecified chronic kidney disease: Secondary | ICD-10-CM | POA: Insufficient documentation

## 2023-05-26 DIAGNOSIS — R519 Headache, unspecified: Secondary | ICD-10-CM | POA: Diagnosis not present

## 2023-05-26 DIAGNOSIS — F32A Depression, unspecified: Secondary | ICD-10-CM | POA: Diagnosis not present

## 2023-05-26 DIAGNOSIS — Z79899 Other long term (current) drug therapy: Secondary | ICD-10-CM | POA: Insufficient documentation

## 2023-05-26 HISTORY — DX: Gastro-esophageal reflux disease without esophagitis: K21.9

## 2023-05-26 HISTORY — DX: Essential (primary) hypertension: I10

## 2023-05-26 HISTORY — DX: Headache, unspecified: R51.9

## 2023-05-26 HISTORY — DX: Chronic kidney disease, unspecified: N18.9

## 2023-05-26 HISTORY — DX: Edema, unspecified: R60.9

## 2023-05-26 HISTORY — DX: Other disorders of electrolyte and fluid balance, not elsewhere classified: E87.8

## 2023-05-26 HISTORY — PX: COLONOSCOPY: SHX5424

## 2023-05-26 LAB — POCT PREGNANCY, URINE: Preg Test, Ur: NEGATIVE

## 2023-05-26 SURGERY — COLONOSCOPY
Anesthesia: General

## 2023-05-26 MED ORDER — PROPOFOL 10 MG/ML IV BOLUS
INTRAVENOUS | Status: AC
  Filled 2023-05-26: qty 40

## 2023-05-26 MED ORDER — LIDOCAINE HCL (CARDIAC) PF 100 MG/5ML IV SOSY
PREFILLED_SYRINGE | INTRAVENOUS | Status: DC | PRN
  Administered 2023-05-26: 40 mg via INTRAVENOUS

## 2023-05-26 MED ORDER — PROPOFOL 500 MG/50ML IV EMUL
INTRAVENOUS | Status: DC | PRN
  Administered 2023-05-26: 150 ug/kg/min via INTRAVENOUS

## 2023-05-26 MED ORDER — PROPOFOL 10 MG/ML IV BOLUS
INTRAVENOUS | Status: DC | PRN
Start: 2023-05-26 — End: 2023-05-26
  Administered 2023-05-26: 30 mg via INTRAVENOUS
  Administered 2023-05-26: 50 mg via INTRAVENOUS

## 2023-05-26 MED ORDER — SODIUM CHLORIDE 0.9 % IV SOLN: INTRAVENOUS | Status: DC

## 2023-05-26 NOTE — Transfer of Care (Signed)
Immediate Anesthesia Transfer of Care Note  Patient: Margaret Waller  Procedure(s) Performed: COLONOSCOPY  Patient Location: PACU  Anesthesia Type:MAC  Level of Consciousness: awake  Airway & Oxygen Therapy: Patient Spontanous Breathing  Post-op Assessment: Report given to RN and Post -op Vital signs reviewed and stable  Post vital signs: Reviewed and stable  Last Vitals:  Vitals Value Taken Time  BP    Temp    Pulse    Resp    SpO2      Last Pain:  Vitals:   05/26/23 1242  TempSrc: Temporal  PainSc: 0-No pain         Complications: No notable events documented.

## 2023-05-26 NOTE — Anesthesia Preprocedure Evaluation (Signed)
Anesthesia Evaluation  Patient identified by MRN, date of birth, ID band Patient awake    Reviewed: Allergy & Precautions, NPO status , Patient's Chart, lab work & pertinent test results  History of Anesthesia Complications Negative for: history of anesthetic complications  Airway Mallampati: III  TM Distance: >3 FB Neck ROM: Full    Dental no notable dental hx. (+) Teeth Intact   Pulmonary neg pulmonary ROS, neg sleep apnea, neg COPD, Patient abstained from smoking.Not current smoker, former smoker   Pulmonary exam normal breath sounds clear to auscultation       Cardiovascular Exercise Tolerance: Good METShypertension, Pt. on medications (-) CAD and (-) Past MI (-) dysrhythmias  Rhythm:Regular Rate:Normal - Systolic murmurs    Neuro/Psych  Headaches PSYCHIATRIC DISORDERS Anxiety Depression     negative psych ROS   GI/Hepatic ,GERD  Medicated,,(+)     (-) substance abuse    Endo/Other  neg diabetes    Renal/GU CRFRenal disease     Musculoskeletal   Abdominal   Peds  Hematology   Anesthesia Other Findings Past Medical History: No date: Anxiety No date: Cancer [facility])     Comment:  skin No date: Chronic kidney disease No date: Depression No date: Edema No date: GERD (gastroesophageal reflux disease) No date: Headache No date: Hyperchloremia No date: Hypertension  Reproductive/Obstetrics                             Anesthesia Physical Anesthesia Plan  ASA: 2  Anesthesia Plan: General   Post-op Pain Management: Minimal or no pain anticipated   Induction: Intravenous  PONV Risk Score and Plan: 3 and Propofol infusion, TIVA and Ondansetron  Airway Management Planned: Nasal Cannula  Additional Equipment: None  Intra-op Plan:   Post-operative Plan:   Informed Consent: I have reviewed the patients History and Physical, chart, labs and discussed the procedure including the  risks, benefits and alternatives for the proposed anesthesia with the patient or authorized representative who has indicated his/her understanding and acceptance.     Dental advisory given  Plan Discussed with: CRNA and Surgeon  Anesthesia Plan Comments: (Discussed risks of anesthesia with patient, including possibility of difficulty with spontaneous ventilation under anesthesia necessitating airway intervention, PONV, and rare risks such as cardiac or respiratory or neurological events, and allergic reactions. Discussed the role of CRNA in patient's perioperative care. Patient understands.)       Anesthesia Quick Evaluation

## 2023-05-26 NOTE — Anesthesia Postprocedure Evaluation (Signed)
Anesthesia Post Note  Patient: Margaret Waller  Procedure(s) Performed: COLONOSCOPY  Patient location during evaluation: Endoscopy Anesthesia Type: General Level of consciousness: awake and alert Pain management: pain level controlled Vital Signs Assessment: post-procedure vital signs reviewed and stable Respiratory status: spontaneous breathing, nonlabored ventilation, respiratory function stable and patient connected to nasal cannula oxygen Cardiovascular status: blood pressure returned to baseline and stable Postop Assessment: no apparent nausea or vomiting Anesthetic complications: no   No notable events documented.   Last Vitals:  Vitals:   05/26/23 1337 05/26/23 1353  BP: 117/71 131/85  Pulse: 84 72  Resp: 14 (!) 21  Temp:    SpO2: 100% 100%    Last Pain:  Vitals:   05/26/23 1333  TempSrc: Temporal  PainSc: 0-No pain                 Corinda Gubler

## 2023-05-26 NOTE — Interval H&P Note (Signed)
History and Physical Interval Note:  05/26/2023 1:06 PM  Margaret Waller  has presented today for surgery, with the diagnosis of V76.51 (ICD-9-CM) - Z12.11 (ICD-10-CM) - Colon cancer screening.  The various methods of treatment have been discussed with the patient and family. After consideration of risks, benefits and other options for treatment, the patient has consented to  Procedure(s): COLONOSCOPY (N/A) as a surgical intervention.  The patient's history has been reviewed, patient examined, no change in status, stable for surgery.  I have reviewed the patient's chart and labs.  Questions were answered to the patient's satisfaction.     [place], [place]

## 2023-05-26 NOTE — H&P (Signed)
Outpatient short stay form Pre-procedure 05/26/2023 1:04 PM Margaret Waller K. Norma Fredrickson, M.D.  Primary Physician:  Enid Baas, M.D.  Reason for visit:  Colon cancer screening  History of present illness:  47 y/o female with hx of chronic idiopathic constipation presents for colon cancer. Denies rectal bleeding, involuntary weight loss, abdominal pain, nausea or vomiting.    Current Facility-Administered Medications:    0.9 %  sodium chloride infusion, , Intravenous, Continuous, Mayanna Garlitz, Boykin Nearing, MD  Medications Prior to Admission  Medication Sig Dispense Refill Last Dose   buPROPion (WELLBUTRIN SR) 150 MG 12 hr tablet TAKE ONE TABLET BY MOUTH DAILY FOR THREE DAYS. THEN TAKE ONE TABLET BY MOUTH TWICE DAILY THEREAFTER 180 tablet 0 05/25/2023   busPIRone (BUSPAR) 7.5 MG tablet Take by mouth.   05/25/2023   gabapentin (NEURONTIN) 100 MG capsule Take 100 mg by mouth 3 (three) times daily.      losartan-hydrochlorothiazide (HYZAAR) 100-25 MG tablet Take 1 tablet by mouth daily. 90 tablet 3 05/25/2023   Vitamin D, Ergocalciferol, (DRISDOL) 1.25 MG (50000 UT) CAPS capsule Take 1 capsule (50,000 Units total) by mouth every 7 (seven) days. 8 capsule 0 Past Week   acetaminophen (TYLENOL) 500 MG tablet Take by mouth.      Cholecalciferol (VITAMIN D-1000 MAX ST) 25 MCG (1000 UT) tablet Take by mouth.      cyclobenzaprine (FLEXERIL) 10 MG tablet Take by mouth.      EQ ALLERGY RELIEF, CETIRIZINE, 10 MG tablet Take 1 tablet by mouth once daily 90 tablet 0    Erenumab-aooe (AIMOVIG) 140 MG/ML SOAJ Inject into the skin.      linaclotide (LINZESS) 145 MCG CAPS capsule Take 1 capsule (145 mcg total) by mouth daily before breakfast. 90 capsule 1    medroxyPROGESTERone Acetate 150 MG/ML SUSY Inject into the muscle.      nortriptyline (PAMELOR) 50 MG capsule Take by mouth.      omeprazole (PRILOSEC) 20 MG capsule Take 1 capsule by mouth once daily 90 capsule 0    ondansetron (ZOFRAN) 4 MG tablet Take 1 tablet (4  mg total) by mouth every 8 (eight) hours as needed for up to 10 doses for nausea or vomiting. 10 tablet 0    topiramate (TOPAMAX) 100 MG tablet Take by mouth.      Topiramate ER (TROKENDI XR) 200 MG CP24 Take by mouth.        No Known Allergies   Past Medical History:  Diagnosis Date   Anxiety    Cancer (HCC)    skin   Chronic kidney disease    Depression    Edema    GERD (gastroesophageal reflux disease)    Headache    Hyperchloremia    Hypertension     Review of systems:  Otherwise negative.    Physical Exam  Gen: Alert, oriented. Appears stated age.  HEENT: [place]/AT. PERRLA. Lungs: CTA, no wheezes. CV: RR nl S1, S2. Abd: soft, benign, no masses. BS+ Ext: No edema. Pulses 2+    Planned procedures: Proceed with colonoscopy. The patient understands the nature of the planned procedure, indications, risks, alternatives and potential complications including but not limited to bleeding, infection, perforation, damage to internal organs and possible oversedation/side effects from anesthesia. The patient agrees and gives consent to proceed.  Please refer to procedure notes for findings, recommendations and patient disposition/instructions.     Rickeya Manus K. Norma Fredrickson, M.D. Gastroenterology 05/26/2023  1:04 PM

## 2023-05-26 NOTE — Op Note (Signed)
[facility] Gastroenterology Patient Name: Margaret Waller Procedure Date: 05/26/2023 1:14 PM MRN: [id number] Account #: [id number] Date of Birth: 1976/01/02 Admit Type: Outpatient Age: 47 Room: [facility] ENDO ROOM 2 Gender: Female Note Status: Finalized Instrument Name: Prentice Docker [id number] Procedure:             Colonoscopy Indications:           Screening for colorectal malignant neoplasm Providers:             Royce Macadamia K. Norma Fredrickson MD, MD Referring MD:          Enid Baas, MD (Referring MD) Medicines:             Propofol per Anesthesia Complications:         No immediate complications. Procedure:             Pre-Anesthesia Assessment:                        - The risks and benefits of the procedure and the                         sedation options and risks were discussed with the                         patient. All questions were answered and informed                         consent was obtained.                        - Patient identification and proposed procedure were                         verified prior to the procedure by the nurse. The                         procedure was verified in the procedure room.                        - ASA Grade Assessment: II - A patient with mild                         systemic disease.                        - After reviewing the risks and benefits, the patient                         was deemed in satisfactory condition to undergo the                         procedure.                        After obtaining informed consent, the colonoscope was                         passed under direct vision. Throughout the procedure,                         the  patient's blood pressure, pulse, and oxygen                         saturations were monitored continuously. The                         Colonoscope was introduced through the anus and                         advanced to the the cecum, identified by appendiceal                          orifice and ileocecal valve. The colonoscopy was                         performed without difficulty. The patient tolerated                         the procedure well. The quality of the bowel                         preparation was good. The ileocecal valve, appendiceal                         orifice, and rectum were photographed. Findings:      The perianal and digital rectal examinations were normal. Pertinent       negatives include normal sphincter tone and no palpable rectal lesions.      The entire examined colon appeared normal on direct and retroflexion       views. Impression:            - The entire examined colon is normal on direct and                         retroflexion views.                        - No specimens collected. Recommendation:        - Patient has a contact number available for                         emergencies. The signs and symptoms of potential                         delayed complications were discussed with the patient.                         Return to normal activities tomorrow. Written                         discharge instructions were provided to the patient.                        - Resume previous diet.                        - Continue present medications.                        - Repeat colonoscopy in  10 years for screening                         purposes.                        - Follow up with Jacob Moores, PA-C in the GI office.                         [phone]                        - Return to physician assistant as previously                         scheduled.                        - The findings and recommendations were discussed with                         the patient. Procedure Code(s):     --- Professional ---                        U9811, Colorectal cancer screening; colonoscopy on                         individual not meeting criteria for high risk Diagnosis Code(s):     --- Professional ---                         Z12.11, Encounter for screening for malignant neoplasm                         of colon CPT copyright 2022 [employer]. All rights reserved. The codes documented in this report are preliminary and upon coder review may  be revised to meet current compliance requirements. Stanton Kidney MD, MD 05/26/2023 1:34:37 PM This report has been signed electronically. Number of Addenda: 0 Note Initiated On: 05/26/2023 1:14 PM Scope Withdrawal Time: 0 hours 6 minutes 1 second  Total Procedure Duration: 0 hours 10 minutes 44 seconds  Estimated Blood Loss:  Estimated blood loss: none.      [facility]

## 2023-05-27 ENCOUNTER — Encounter: Payer: Self-pay | Admitting: Internal Medicine

## 2023-08-28 DIAGNOSIS — R051 Acute cough: Secondary | ICD-10-CM | POA: Diagnosis not present

## 2023-08-28 DIAGNOSIS — Z6823 Body mass index (BMI) 23.0-23.9, adult: Secondary | ICD-10-CM | POA: Diagnosis not present

## 2023-08-28 DIAGNOSIS — R519 Headache, unspecified: Secondary | ICD-10-CM | POA: Diagnosis not present

## 2023-08-28 DIAGNOSIS — R059 Cough, unspecified: Secondary | ICD-10-CM | POA: Diagnosis not present

## 2023-08-28 DIAGNOSIS — Z1152 Encounter for screening for COVID-19: Secondary | ICD-10-CM | POA: Diagnosis not present

## 2023-08-28 DIAGNOSIS — J069 Acute upper respiratory infection, unspecified: Secondary | ICD-10-CM | POA: Diagnosis not present

## 2023-08-28 DIAGNOSIS — J029 Acute pharyngitis, unspecified: Secondary | ICD-10-CM | POA: Diagnosis not present

## 2023-09-09 ENCOUNTER — Encounter: Payer: Self-pay | Admitting: Internal Medicine

## 2023-10-14 DIAGNOSIS — G43109 Migraine with aura, not intractable, without status migrainosus: Secondary | ICD-10-CM | POA: Diagnosis not present

## 2023-10-14 DIAGNOSIS — G4700 Insomnia, unspecified: Secondary | ICD-10-CM | POA: Diagnosis not present

## 2023-10-14 DIAGNOSIS — G4719 Other hypersomnia: Secondary | ICD-10-CM | POA: Diagnosis not present
